# Patient Record
Sex: Female | Born: 1965 | Race: White | Hispanic: No | Marital: Married | Smoking: Never smoker
Health system: Southern US, Community
[De-identification: ages and names within clinical notes are randomized; demographics above are authoritative.]

## PROBLEM LIST (undated history)

## (undated) DIAGNOSIS — O1490 Unspecified pre-eclampsia, unspecified trimester: Secondary | ICD-10-CM

## (undated) DIAGNOSIS — K649 Unspecified hemorrhoids: Secondary | ICD-10-CM

## (undated) DIAGNOSIS — E785 Hyperlipidemia, unspecified: Secondary | ICD-10-CM

## (undated) DIAGNOSIS — R51 Headache: Secondary | ICD-10-CM

## (undated) DIAGNOSIS — R232 Flushing: Secondary | ICD-10-CM

## (undated) DIAGNOSIS — I10 Essential (primary) hypertension: Secondary | ICD-10-CM

## (undated) HISTORY — DX: Hyperlipidemia, unspecified: E78.5

## (undated) HISTORY — DX: Unspecified hemorrhoids: K64.9

## (undated) HISTORY — DX: Essential (primary) hypertension: I10

## (undated) HISTORY — DX: Headache: R51

## (undated) HISTORY — PX: OTHER SURGICAL HISTORY: SHX169

## (undated) HISTORY — PX: CHOLECYSTECTOMY: SHX55

## (undated) HISTORY — DX: Unspecified pre-eclampsia, unspecified trimester: O14.90

## (undated) HISTORY — DX: Flushing: R23.2

---

## 2005-03-05 ENCOUNTER — Ambulatory Visit: Payer: Self-pay | Admitting: Internal Medicine

## 2006-11-25 ENCOUNTER — Ambulatory Visit: Payer: Self-pay | Admitting: Internal Medicine

## 2008-02-03 ENCOUNTER — Ambulatory Visit: Payer: Self-pay | Admitting: Internal Medicine

## 2008-02-03 DIAGNOSIS — R519 Headache, unspecified: Secondary | ICD-10-CM

## 2008-02-03 HISTORY — DX: Headache, unspecified: R51.9

## 2008-02-18 ENCOUNTER — Telehealth: Payer: Self-pay | Admitting: Internal Medicine

## 2009-02-01 ENCOUNTER — Ambulatory Visit: Payer: Self-pay | Admitting: Internal Medicine

## 2010-03-05 ENCOUNTER — Encounter: Payer: Self-pay | Admitting: Internal Medicine

## 2010-04-19 ENCOUNTER — Ambulatory Visit: Payer: Self-pay | Admitting: Internal Medicine

## 2010-04-19 DIAGNOSIS — I10 Essential (primary) hypertension: Secondary | ICD-10-CM | POA: Insufficient documentation

## 2010-04-19 HISTORY — DX: Essential (primary) hypertension: I10

## 2010-06-09 ENCOUNTER — Ambulatory Visit: Payer: Self-pay | Admitting: Diagnostic Radiology

## 2010-06-25 ENCOUNTER — Ambulatory Visit: Payer: Self-pay | Admitting: Internal Medicine

## 2010-06-25 DIAGNOSIS — J069 Acute upper respiratory infection, unspecified: Secondary | ICD-10-CM | POA: Insufficient documentation

## 2010-09-20 ENCOUNTER — Emergency Department (HOSPITAL_BASED_OUTPATIENT_CLINIC_OR_DEPARTMENT_OTHER): Admission: EM | Admit: 2010-09-20 | Discharge: 2010-06-09 | Payer: Self-pay | Admitting: Emergency Medicine

## 2010-11-13 NOTE — Assessment & Plan Note (Signed)
Summary: 2 month rov/njr rsc bmp/njr   Vital Signs:  Patient profile:   45 year old female Weight:      168 pounds Temp:     98.3 degrees F oral BP sitting:   130 / 88  (left arm) Cuff size:   regular  Vitals Entered By: Kern Reap CMA Duncan Dull) (June 25, 2010 8:22 AM) CC: follow-up visit for blood pressure, dry cough Is Patient Diabetic? No Pain Assessment Patient in pain? no        CC:  follow-up visit for blood pressure and dry cough.  History of Present Illness: f/u BP---average BP 138/88 has had a few bps >160/90  has been to [facility] and ED for URI sxs---had difficulty breathing (went to ED).  cough is improving---now has some throat irritiation that triggers cough she has been given a cough syrup and OTC meds Also tried prednisone taper ([facility])  All other systems reviewed and were negative   Current Problems (verified): 1)  Elevated Bp Reading Without Dx Hypertension  (ICD-796.2) 2)  Headache  (ICD-784.0)  Current Medications (verified): 1)  Sumatriptan Succinate 100 Mg  Tabs (Sumatriptan Succinate) .Marland Kitchen.. 1 By Mouth Once Daily As Needed Headache 2)  Ibuprofen 200 Mg Tabs (Ibuprofen) .... Take One Tab By Mouth As Needed  Allergies (verified): 1)  Sulfamethoxazole (Sulfamethoxazole)  Physical Exam  General:  alert and well-developed.   Head:  normocephalic and atraumatic.   Eyes:  pupils equal and pupils round.   Ears:  R ear normal and L ear normal.   Neck:  No deformities, masses, or tenderness noted. Chest Wall:  no deformities and no tenderness.   Lungs:  normal respiratory effort and no intercostal retractions.   Heart:  normal rate and regular rhythm.   Skin:  turgor normal and color normal.     Impression & Recommendations:  Problem # 1:  ELEVATED BP READING WITHOUT DX HYPERTENSION (ICD-796.2) no ned for Rx at this time  Problem # 2:  URI (ICD-465.9) no evidence of bacterial infection. call for any concerns, increased sxs, fever, persistence  of sxs, wheeze, SOB.  i wonder about GERD since sxs are very nocturnal. Her updated medication list for this problem includes:    Ibuprofen 200 Mg Tabs (Ibuprofen) .Marland Kitchen... Take one tab by mouth as needed  Complete Medication List: 1)  Sumatriptan Succinate 100 Mg Tabs (Sumatriptan succinate) .Marland Kitchen.. 1 by mouth once daily as needed headache 2)  Ibuprofen 200 Mg Tabs (Ibuprofen) .... Take one tab by mouth as needed  Patient Instructions: 1)  Please schedule a follow-up appointment in 6 months.  Prevention & Chronic Care Immunizations   Influenza vaccine: Not documented    Tetanus booster: 01/14/2003: Historical    Pneumococcal vaccine: Not documented  Other Screening   Pap smear: Not documented    Mammogram: normal per pt  (03/05/2010)   Smoking status: never  (04/19/2010)  Lipids   Total Cholesterol: Not documented   LDL: Not documented   LDL Direct: Not documented   HDL: Not documented   Triglycerides: Not documented

## 2010-11-13 NOTE — Assessment & Plan Note (Signed)
Summary: BP ELEVATION/PS   Vital Signs:  Patient profile:   45 year old female Weight:      166 pounds BMI:     26.49 Temp:     98.8 degrees F oral Pulse rate:   72 / minute Pulse rhythm:   regular Resp:     12 per minute BP sitting:   130 / 78  (left arm) Cuff size:   regular  Vitals Entered By: Gladis Riffle, RN (April 19, 2010 8:16 AM) CC: BP 150/90 at gyn with migraine--BP 121/79-155-86 at [facility] Is Patient Diabetic? No   CC:  BP 150/90 at gyn with migraine--BP 121/79-155-86 at [facility].  History of Present Illness: concerned with BP gyn office bp 150/90 has checked since then. bps range from 121-155/79-105  no sxs of htn FHx of htn  Preventive Screening-Counseling & Management  Alcohol-Tobacco     Smoking Status: never  Current Medications (verified): 1)  Sumatriptan Succinate 100 Mg  Tabs (Sumatriptan Succinate) .Marland Kitchen.. 1 By Mouth Once Daily As Needed Headache  Allergies: 1)  Sulfamethoxazole (Sulfamethoxazole)  Physical Exam  General:  Well-developed,well-nourished,in no acute distress; alert,appropriate and cooperative throughout examination Head:  normocephalic and atraumatic.   Eyes:  pupils equal and pupils round.   Ears:  hearing aids Msk:  No deformity or scoliosis noted of thoracic or lumbar spine.   Neurologic:  cranial nerves II-XII intact and gait normal.     Impression & Recommendations:  Problem # 1:  ELEVATED BP READING WITHOUT DX HYPERTENSION (ICD-796.2) weight loss daily exercise f/u in 2 months  Complete Medication List: 1)  Sumatriptan Succinate 100 Mg Tabs (Sumatriptan succinate) .Marland Kitchen.. 1 by mouth once daily as needed headache  Patient Instructions: 1)  Please schedule a follow-up appointment in 2 months.   Immunization History:  Tetanus/Td Immunization History:    Tetanus/Td:  historical (01/14/2003)    Preventive Care Screening  Mammogram:    Date:  03/05/2010    Results:  normal per pt   Last Tetanus Booster:    Date:   01/14/2003    Results:  Historical

## 2010-12-24 ENCOUNTER — Ambulatory Visit: Payer: Self-pay | Admitting: Internal Medicine

## 2010-12-28 LAB — DIFFERENTIAL
Basophils Absolute: 0.1 10*3/uL (ref 0.0–0.1)
Lymphs Abs: 2.7 10*3/uL (ref 0.7–4.0)
Monocytes Relative: 11 % (ref 3–12)
Neutro Abs: 5.8 10*3/uL (ref 1.7–7.7)
Neutrophils Relative %: 59 % (ref 43–77)

## 2010-12-28 LAB — URINALYSIS, ROUTINE W REFLEX MICROSCOPIC
Hgb urine dipstick: NEGATIVE
Protein, ur: NEGATIVE mg/dL
Specific Gravity, Urine: 1.018 (ref 1.005–1.030)
pH: 6.5 (ref 5.0–8.0)

## 2010-12-28 LAB — BASIC METABOLIC PANEL
BUN: 19 mg/dL (ref 6–23)
Chloride: 104 mEq/L (ref 96–112)
Creatinine, Ser: 1 mg/dL (ref 0.4–1.2)
GFR calc non Af Amer: 60 mL/min — ABNORMAL LOW (ref >60)
Glucose, Bld: 102 mg/dL — ABNORMAL HIGH (ref 70–99)
Sodium: 140 mEq/L (ref 135–145)

## 2010-12-28 LAB — POCT B-TYPE NATRIURETIC PEPTIDE (BNP): B Natriuretic Peptide, POC: 7.1 pg/mL (ref 0–100)

## 2010-12-28 LAB — URINE MICROSCOPIC-ADD ON

## 2010-12-28 LAB — CBC: Platelets: 252 10*3/uL (ref 150–400)

## 2011-01-08 ENCOUNTER — Encounter: Payer: Self-pay | Admitting: Internal Medicine

## 2011-01-09 ENCOUNTER — Ambulatory Visit (INDEPENDENT_AMBULATORY_CARE_PROVIDER_SITE_OTHER): Payer: BC Managed Care – PPO | Admitting: Internal Medicine

## 2011-01-09 ENCOUNTER — Encounter: Payer: Self-pay | Admitting: Internal Medicine

## 2011-01-09 DIAGNOSIS — Z23 Encounter for immunization: Secondary | ICD-10-CM

## 2011-01-09 DIAGNOSIS — R51 Headache: Secondary | ICD-10-CM

## 2011-01-09 DIAGNOSIS — R030 Elevated blood-pressure reading, without diagnosis of hypertension: Secondary | ICD-10-CM

## 2011-01-09 MED ORDER — VERAPAMIL HCL ER 180 MG PO TBCR: 180.0000 mg | EXTENDED_RELEASE_TABLET | Freq: Every day | ORAL | Status: DC

## 2011-01-09 NOTE — Assessment & Plan Note (Signed)
Menstrual related  Uses imitrex with relief

## 2011-01-09 NOTE — Progress Notes (Signed)
  Subjective:    Patient ID: Belinda Maxwell, female    DOB: 1965-12-06, 45 y.o.   MRN: [id number]  HPI  Patient comes in for evaluation of blood pressure. Home blood pressure monitoring results are in the assessment and plan. She otherwise has no complaints. She denies any chest pain, neurologic deficits. No swelling of lower extremities. No shortness of breath.  Patient has headaches. Mostly menstrual-related. Imitrex with good results.  Past Medical History  Diagnosis Date  . Hearing loss   . Pre-eclampsia   . Headache    Past Surgical History  Procedure Date  . Cholecystectomy   . Hemorrhoid banding     reports that she has never smoked. She does not have any smokeless tobacco history on file. Her alcohol and drug histories not on file. family history includes Hyperlipidemia in her father and mother and Hypertension in her father and mother. Allergies  Allergen Reactions  . Sulfamethoxazole     REACTION: urticaria (hives)     Review of Systems  patient denies chest pain, shortness of breath, orthopnea. Denies lower extremity edema, abdominal pain, change in appetite, change in bowel movements. Patient denies rashes, musculoskeletal complaints. No other specific complaints in a complete review of systems.      Objective:   Physical Exam Well-developed female no acute distress. HEENT exam atraumatic, normocephalic,  Without jugular venous distention. Chest clear to auscultation cardiac exam S1 and S2 are regular. There is no edema.       Assessment & Plan:

## 2011-01-09 NOTE — Assessment & Plan Note (Signed)
Home BPS are in the 130-154/78-100 range.  Trial Ca++ channel blocker---may help headache too Side effects discussed

## 2011-02-20 ENCOUNTER — Encounter: Payer: Self-pay | Admitting: Internal Medicine

## 2011-02-20 ENCOUNTER — Ambulatory Visit (INDEPENDENT_AMBULATORY_CARE_PROVIDER_SITE_OTHER): Payer: BC Managed Care – PPO | Admitting: Internal Medicine

## 2011-02-20 VITALS — BP 144/94 | HR 76 | Temp 98.5°F | Ht 66.0 in | Wt 174.0 lb

## 2011-02-20 DIAGNOSIS — I10 Essential (primary) hypertension: Secondary | ICD-10-CM

## 2011-02-20 DIAGNOSIS — R030 Elevated blood-pressure reading, without diagnosis of hypertension: Secondary | ICD-10-CM

## 2011-02-20 MED ORDER — VERAPAMIL HCL ER 240 MG PO TBCR: 240.0000 mg | EXTENDED_RELEASE_TABLET | Freq: Every day | ORAL | Status: DC

## 2011-02-20 NOTE — Assessment & Plan Note (Signed)
Need better control increase verapamil See me 6 weeks

## 2011-02-20 NOTE — Progress Notes (Signed)
  Subjective:    Patient ID: Belinda Maxwell, female    DOB: 1966/03/18, 45 y.o.   MRN: [id number]  HPI  HTN---rarely checks home bps--140/90  Migraines--controlled with imitrex (prophylactic use of verapamil) Past Medical History  Diagnosis Date  . Hearing loss   . Pre-eclampsia   . Headache    Past Surgical History  Procedure Date  . Cholecystectomy   . Hemorrhoid banding     reports that she has never smoked. She does not have any smokeless tobacco history on file. Her alcohol and drug histories not on file. family history includes Hyperlipidemia in her father and mother and Hypertension in her father and mother. Allergies  Allergen Reactions  . Sulfamethoxazole     REACTION: urticaria (hives)     Review of Systems  patient denies chest pain, shortness of breath, orthopnea. Denies lower extremity edema, abdominal pain, change in appetite, change in bowel movements. Patient denies rashes, musculoskeletal complaints. No other specific complaints in a complete review of systems.      Objective:   Physical Exam  Well-developed well-nourished female in no acute distress. HEENT exam atraumatic, normocephalic, extraocular muscles are intact. Neck is supple. No jugular venous distention no thyromegaly. Chest clear to auscultation without increased work of breathing. Cardiac exam S1 and S2 are regular. Abdominal exam active bowel sounds, soft, nontender. Extremities no edema. Neurologic exam she is alert without any motor sensory deficits. Gait is normal.        Assessment & Plan:

## 2011-04-03 ENCOUNTER — Ambulatory Visit: Payer: BC Managed Care – PPO | Admitting: Internal Medicine

## 2011-05-10 LAB — HM PAP SMEAR

## 2011-05-13 ENCOUNTER — Ambulatory Visit: Payer: BC Managed Care – PPO | Admitting: Internal Medicine

## 2011-07-01 ENCOUNTER — Ambulatory Visit: Payer: BC Managed Care – PPO | Admitting: Internal Medicine

## 2011-07-13 IMAGING — CR DG CHEST 2V
2 series · 2 of 2 positions shown · non-contrast
Comparison: None

CLINICAL DATA: Shortness of breath.

CHEST - 2 VIEW

[w chest pa]
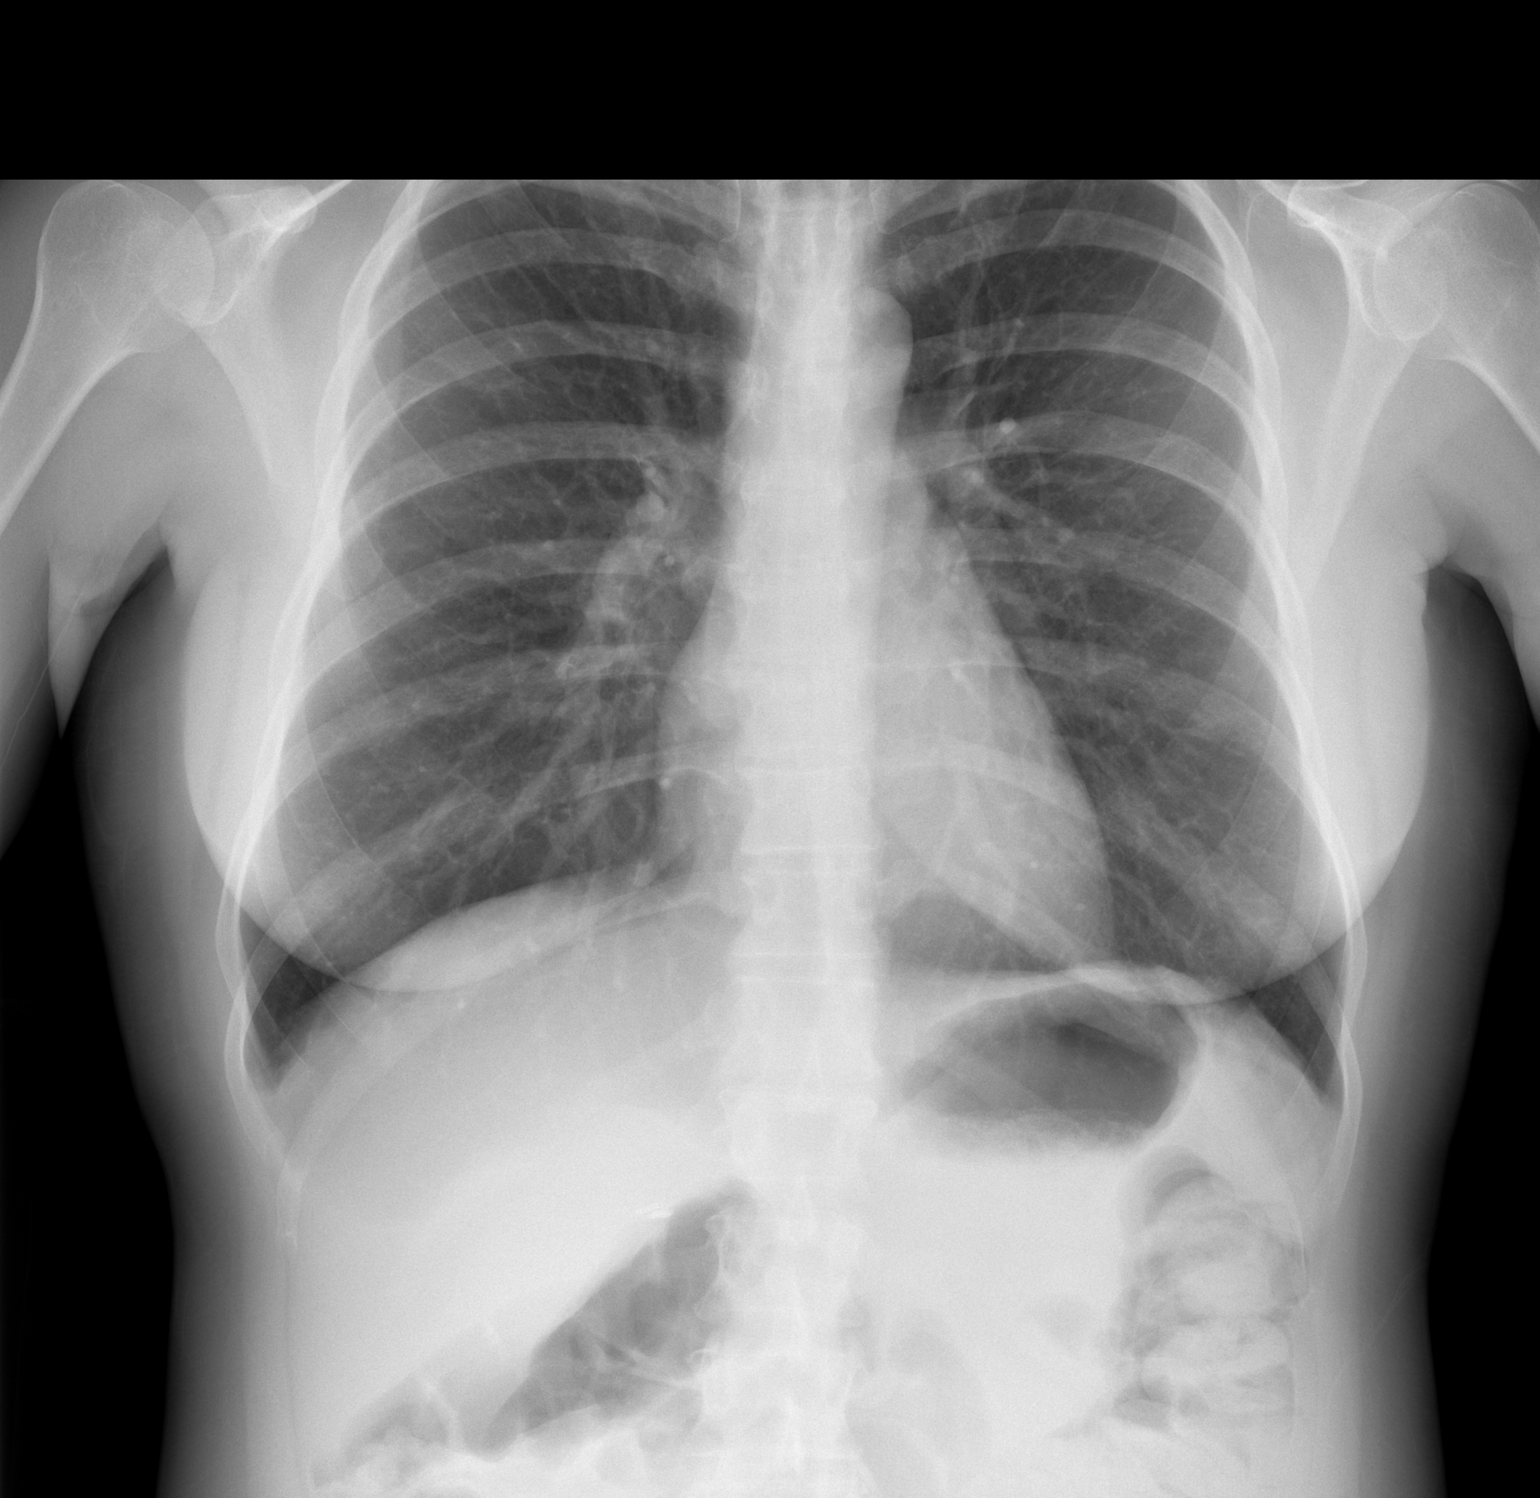

[w chest lat]
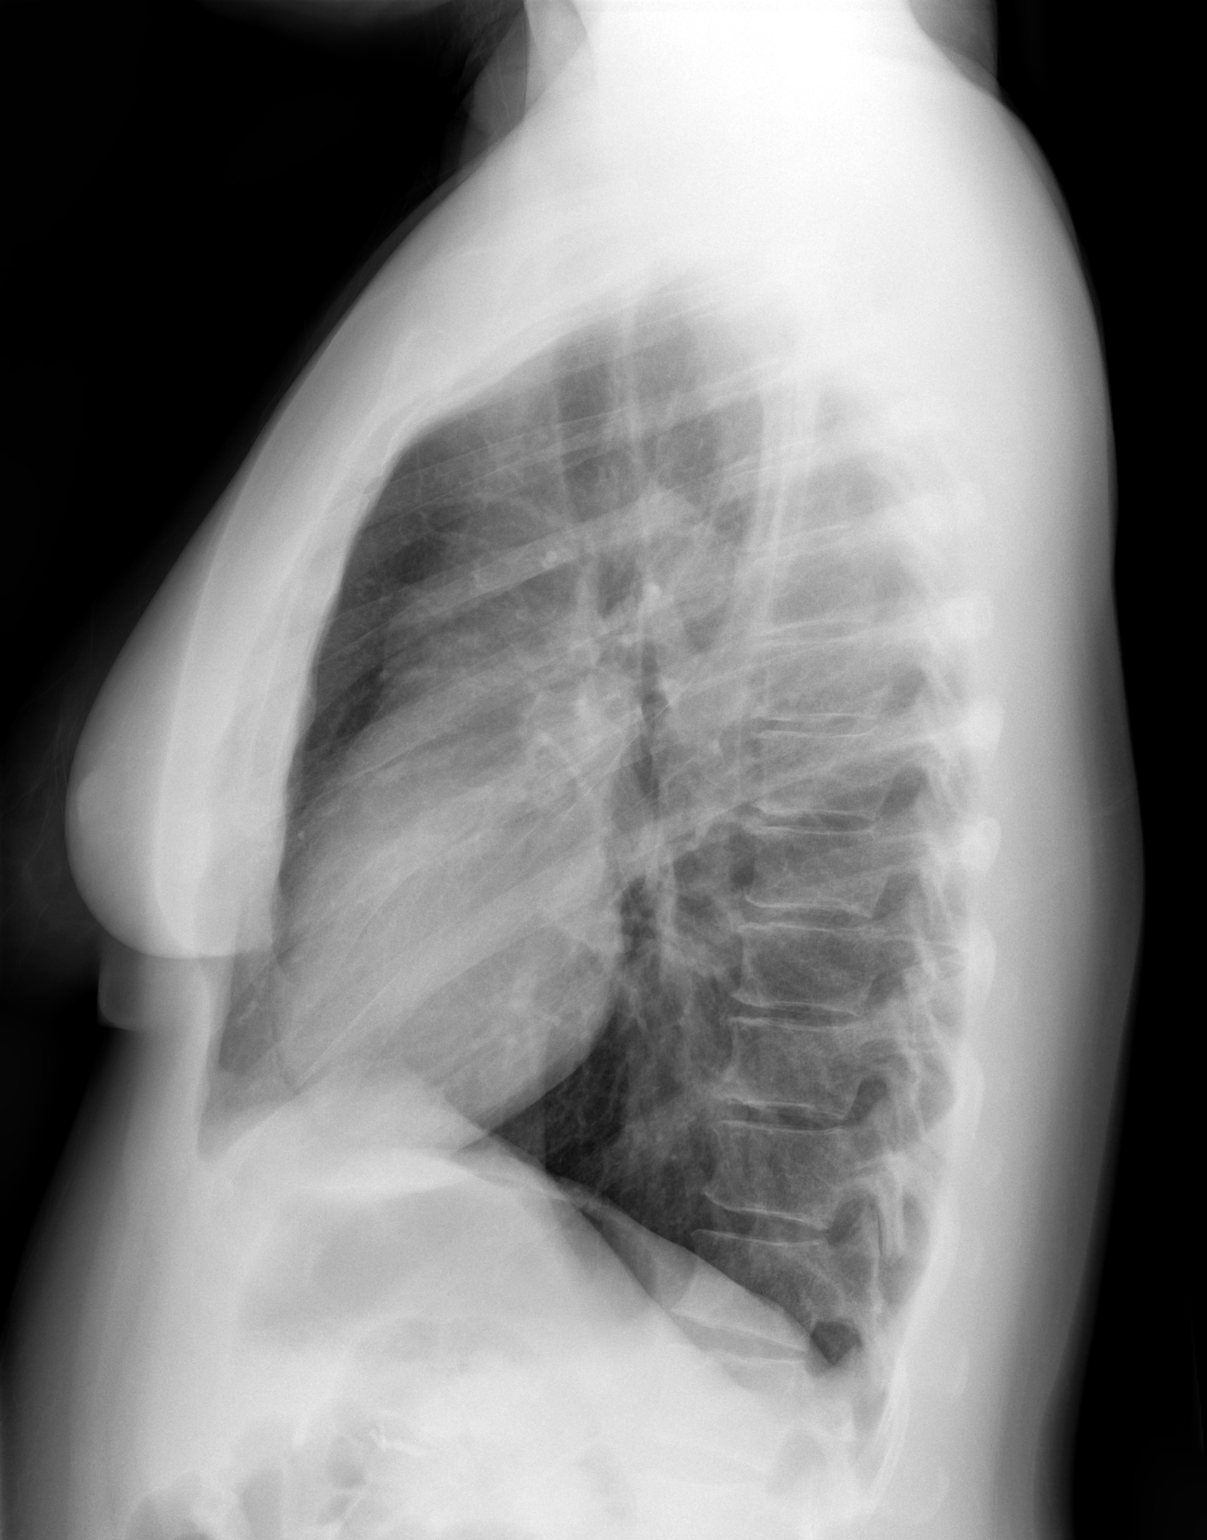

[2 of 2 positions shown; findings below may reference images not displayed]

FINDINGS: Heart and mediastinal contours are within normal limits.
No focal opacities or effusions.  No acute bony abnormality.
IMPRESSION: No active disease.

## 2011-07-30 ENCOUNTER — Ambulatory Visit: Payer: BC Managed Care – PPO | Admitting: Internal Medicine

## 2011-08-07 ENCOUNTER — Encounter: Payer: Self-pay | Admitting: Internal Medicine

## 2011-08-07 ENCOUNTER — Ambulatory Visit (INDEPENDENT_AMBULATORY_CARE_PROVIDER_SITE_OTHER): Payer: PRIVATE HEALTH INSURANCE | Admitting: Internal Medicine

## 2011-08-07 VITALS — BP 148/98 | HR 76 | Temp 98.5°F | Ht 66.0 in | Wt 176.0 lb

## 2011-08-07 DIAGNOSIS — I10 Essential (primary) hypertension: Secondary | ICD-10-CM

## 2011-08-07 MED ORDER — HYDROCORTISONE 2.5 % RE CREA: TOPICAL_CREAM | RECTAL | Status: DC

## 2011-08-07 MED ORDER — LISINOPRIL-HYDROCHLOROTHIAZIDE 20-25 MG PO TABS: 1.0000 | ORAL_TABLET | Freq: Every day | ORAL | Status: DC

## 2011-08-07 NOTE — Assessment & Plan Note (Signed)
Not as well controlled Need to change meds See new list Side effects discussed

## 2011-08-07 NOTE — Progress Notes (Signed)
  Subjective:    Patient ID: Belinda Maxwell, female    DOB: [birth year], 45 y.o.   MRN: [id number]  HPI  htn---tolerating verapamil but bps have creeped back up to mid 140s/90s. She feels well  Migraines---reasonably controlled  Past Medical History  Diagnosis Date  . Hearing loss   . Pre-eclampsia   . Headache    Past Surgical History  Procedure Date  . Cholecystectomy   . Hemorrhoid banding     reports that she has never smoked. She does not have any smokeless tobacco history on file. Her alcohol and drug histories not on file. family history includes Hyperlipidemia in her father and mother and Hypertension in her father and mother. Allergies  Allergen Reactions  . Sulfamethoxazole     REACTION: urticaria (hives)     Review of Systems  patient denies chest pain, shortness of breath, orthopnea. Denies lower extremity edema, abdominal pain, change in appetite, change in bowel movements. Patient denies rashes, musculoskeletal complaints. No other specific complaints in a complete review of systems.      Objective:   Physical Exam  Well-developed well-nourished female in no acute distress. HEENT exam atraumatic, normocephalic, extraocular muscles are intact. Neck is supple. No jugular venous distention no thyromegaly. Chest clear to auscultation without increased work of breathing. Cardiac exam S1 and S2 are regular. Abdominal exam active bowel sounds, soft, nontender. Extremities no edema. Neurologic exam she is alert without any motor sensory deficits. Gait is normal.        Assessment & Plan:

## 2011-10-09 ENCOUNTER — Emergency Department (HOSPITAL_COMMUNITY): Payer: No Typology Code available for payment source

## 2011-10-09 ENCOUNTER — Emergency Department (HOSPITAL_COMMUNITY)
Admission: EM | Admit: 2011-10-09 | Discharge: 2011-10-09 | Disposition: A | Discharge status: Home / Self Care | Payer: No Typology Code available for payment source | Attending: Emergency Medicine | Admitting: Emergency Medicine

## 2011-10-09 ENCOUNTER — Encounter (HOSPITAL_COMMUNITY): Payer: Self-pay | Admitting: Emergency Medicine

## 2011-10-09 DIAGNOSIS — R55 Syncope and collapse: Secondary | ICD-10-CM | POA: Insufficient documentation

## 2011-10-09 DIAGNOSIS — R112 Nausea with vomiting, unspecified: Secondary | ICD-10-CM | POA: Insufficient documentation

## 2011-10-09 DIAGNOSIS — I10 Essential (primary) hypertension: Secondary | ICD-10-CM | POA: Insufficient documentation

## 2011-10-09 DIAGNOSIS — J189 Pneumonia, unspecified organism: Secondary | ICD-10-CM | POA: Insufficient documentation

## 2011-10-09 LAB — DIFFERENTIAL
Basophils Absolute: 0 10*3/uL (ref 0.0–0.1)
Eosinophils Absolute: 0 10*3/uL (ref 0.0–0.7)
Monocytes Absolute: 0.8 10*3/uL (ref 0.1–1.0)
Neutro Abs: 8.8 10*3/uL — ABNORMAL HIGH (ref 1.7–7.7)

## 2011-10-09 LAB — URINALYSIS, ROUTINE W REFLEX MICROSCOPIC
Nitrite: NEGATIVE
Protein, ur: 30 mg/dL — AB
Urobilinogen, UA: 0.2 mg/dL (ref 0.0–1.0)
pH: 6 (ref 5.0–8.0)

## 2011-10-09 LAB — BASIC METABOLIC PANEL
CO2: 28 mEq/L (ref 19–32)
Calcium: 9.6 mg/dL (ref 8.4–10.5)
Chloride: 99 mEq/L (ref 96–112)
Creatinine, Ser: 1.11 mg/dL — ABNORMAL HIGH (ref 0.50–1.10)
Glucose, Bld: 125 mg/dL — ABNORMAL HIGH (ref 70–99)
Potassium: 2.9 mEq/L — ABNORMAL LOW (ref 3.5–5.1)
Sodium: 137 mEq/L (ref 135–145)

## 2011-10-09 LAB — CBC
MCH: 32.8 pg (ref 26.0–34.0)
MCHC: 35.8 g/dL (ref 30.0–36.0)
MCV: 91.5 fL (ref 78.0–100.0)
Platelets: 150 10*3/uL (ref 150–400)

## 2011-10-09 LAB — URINE MICROSCOPIC-ADD ON

## 2011-10-09 MED ORDER — SODIUM CHLORIDE 0.9 % IV BOLUS (SEPSIS)
1000.0000 mL | Freq: Once | INTRAVENOUS | Status: AC
  Administered 2011-10-09: 1000 mL via INTRAVENOUS

## 2011-10-09 MED ORDER — AZITHROMYCIN 250 MG PO TABS: ORAL_TABLET | ORAL | Status: DC

## 2011-10-09 MED ORDER — POTASSIUM CHLORIDE CRYS ER 20 MEQ PO TBCR
40.0000 meq | EXTENDED_RELEASE_TABLET | Freq: Two times a day (BID) | ORAL | Status: DC
  Administered 2011-10-09 (×2): 40 meq via ORAL
  Filled 2011-10-09: qty 2
  Filled 2011-10-09 (×2): qty 1

## 2011-10-09 MED ORDER — BENZONATATE 100 MG PO CAPS: 100.0000 mg | ORAL_CAPSULE | Freq: Three times a day (TID) | ORAL | Status: DC

## 2011-10-09 MED ORDER — DEXTROSE 5 % IV SOLN
1.0000 g | Freq: Once | INTRAVENOUS | Status: AC
  Administered 2011-10-09: 1 g via INTRAVENOUS
  Filled 2011-10-09: qty 10

## 2011-10-09 MED ORDER — DEXTROSE 5 % IV SOLN
500.0000 mg | Freq: Once | INTRAVENOUS | Status: AC
  Administered 2011-10-09: 500 mg via INTRAVENOUS
  Filled 2011-10-09: qty 500

## 2011-10-09 NOTE — ED Notes (Signed)
Pt presented to the ER with c/o syncopal episode, per husband pt loss a conscience for about 45 sec, body become stiff and eyes stayed open with fixated pupils. Pt did not react, shaking, seizure like movement (tonic-clonic) also noted in the triage. Pt face pale and moth dry.

## 2011-10-09 NOTE — ED Provider Notes (Signed)
History     CSN: [id number]  Arrival date & time 10/09/11  0704   First MD Initiated Contact with Patient 10/09/11 0732      Chief Complaint  Patient presents with  . Loss of Consciousness    (Consider location/radiation/quality/duration/timing/severity/associated sxs/prior treatment) Patient is a 45 y.o. female presenting with syncope. The history is provided by the patient and the spouse.  Loss of Consciousness Pertinent negatives include no chest pain, no abdominal pain, no headaches and no shortness of breath.   the patient is a 45 year old, female, with a history of hypertension, and migraine headaches.  She has had a nonproductive cough and nausea for several days.  5 days ago.  She had diarrhea as well, but that is resolved now.  This morning.  She was in the bathroom and felt as if she was in the vomit.  She had dry heaves and then, she fainted for about 30 seconds according to her husband.  She recovered rapidly afterwards.  Her nausea is resolved now.  She denies a headache.  She denies chest pain, abdominal pain.  She has not had a fever.  She denies shortness of breath, back pain, dysuria, or hematuria.  Past Medical History  Diagnosis Date  . Hearing loss   . Pre-eclampsia   . Headache     Past Surgical History  Procedure Date  . Cholecystectomy   . Hemorrhoid banding     Family History  Problem Relation Age of Onset  . Hyperlipidemia Mother   . Hypertension Mother   . Hyperlipidemia Father   . Hypertension Father     History  Substance Use Topics  . Smoking status: Never Smoker   . Smokeless tobacco: Not on file  . Alcohol Use:     OB History    Grav Para Term Preterm Abortions TAB SAB Ect Mult Living                  Review of Systems  Constitutional: Negative for fever, chills and diaphoresis.  HENT: Negative for congestion and neck pain.   Eyes: Negative for redness.  Respiratory: Positive for cough. Negative for chest tightness and  shortness of breath.   Cardiovascular: Positive for syncope. Negative for chest pain.  Gastrointestinal: Positive for nausea and vomiting. Negative for abdominal pain and diarrhea.  Genitourinary: Negative for dysuria and hematuria.  Musculoskeletal: Negative for back pain.  Skin: Negative for rash.  Neurological: Negative for light-headedness, numbness and headaches.  Psychiatric/Behavioral: Negative for confusion.    Allergies  Sulfamethoxazole  Home Medications   Current Outpatient Rx  Name Route Sig Dispense Refill  . ALBUTEROL SULFATE HFA 108 (90 BASE) MCG/ACT IN AERS Inhalation Inhale 2 puffs into the lungs every 6 (six) hours as needed. WHEEZING     . DEXTROMETHORPHAN POLISTIREX ER 30 MG/5ML PO LQCR Oral Take by mouth as needed.      . DELSYM PO Oral Take 5 mLs by mouth 2 (two) times daily.      Marland Kitchen DM-GUAIFENESIN ER 30-600 MG PO TB12 Oral Take 1 tablet by mouth every 12 (twelve) hours.      Marland Kitchen HYDROCORTISONE 2.5 % RE CREA  Apply rectally 2 times daily 30 g 1  . IBUPROFEN 200 MG PO TABS Oral Take 200 mg by mouth every 6 (six) hours as needed.      Marland Kitchen LISINOPRIL-HYDROCHLOROTHIAZIDE 20-25 MG PO TABS Oral Take 1 tablet by mouth daily. 90 tablet 3  . PROMETHAZINE-CODEINE 6.25-10 MG/5ML PO SYRP  Oral Take 7.5 mLs by mouth every 4 (four) hours as needed. cough     . SUMATRIPTAN SUCCINATE 100 MG PO TABS Oral Take 100 mg by mouth every 2 (two) hours as needed.        BP 107/63  Pulse 92  Temp(Src) 98.6 F (37 C) (Oral)  Resp 20  SpO2 97%  Physical Exam  Vitals reviewed. Constitutional: She is oriented to person, place, and time. She appears well-developed and well-nourished.  HENT:  Head: Normocephalic and atraumatic.  Eyes: Pupils are equal, round, and reactive to light.  Neck: Normal range of motion.  Cardiovascular: Normal rate, regular rhythm and normal heart sounds.   No murmur heard. Pulmonary/Chest: Effort normal. No respiratory distress. She has no wheezes. She has no  rales.       Rhonchi in the posterior left mid lung field  Abdominal: Soft. She exhibits no distension and no mass. There is no tenderness. There is no rebound and no guarding.  Musculoskeletal: Normal range of motion. She exhibits no edema and no tenderness.  Neurological: She is alert and oriented to person, place, and time. No cranial nerve deficit.  Skin: Skin is warm and dry. No rash noted. No erythema.  Psychiatric: She has a normal mood and affect. Her behavior is normal.    ED Course  Procedures (including critical care time) 45 year old, female, with a nonproductive cough, nausea, and vomiting.  Had a vasovagal episode following dry heaves.  She is asymptomatic now.  Since she has rhonchi and a nonproductive cough for several days, now.  We'll perform a chest x-ray, laboratory testing, and hydrate her.   Labs Reviewed  CBC  DIFFERENTIAL  BASIC METABOLIC PANEL  URINALYSIS, ROUTINE W REFLEX MICROSCOPIC   No results found.   8:56 AM sbp 98 Explained cxr findings. Since low bp will give IV bolus and since will be here for ivf bolus will give iv abxs to begin tx of pneumonia   10:12 AM Still getting ivf.  She says bp is always low since on meds.  She denies being light headed.    MDM  Pneumonia No hypoxia or resp distress.         Nicholes Stairs, MD 10/09/11 [phone]

## 2011-10-16 ENCOUNTER — Telehealth: Payer: Self-pay | Admitting: Internal Medicine

## 2011-10-16 NOTE — Telephone Encounter (Signed)
Pt was in the ER on the 12/26 with pneumonia and high blood pressure and needs a ER follow up appt. Next Appt is not until 11/07/10 please contact pt

## 2011-10-18 NOTE — Telephone Encounter (Signed)
appt scheduled

## 2011-10-22 ENCOUNTER — Encounter: Payer: Self-pay | Admitting: Internal Medicine

## 2011-10-22 ENCOUNTER — Ambulatory Visit (INDEPENDENT_AMBULATORY_CARE_PROVIDER_SITE_OTHER): Payer: PRIVATE HEALTH INSURANCE | Admitting: Internal Medicine

## 2011-10-22 DIAGNOSIS — I10 Essential (primary) hypertension: Secondary | ICD-10-CM

## 2011-10-22 DIAGNOSIS — R55 Syncope and collapse: Secondary | ICD-10-CM

## 2011-10-22 DIAGNOSIS — R51 Headache: Secondary | ICD-10-CM

## 2011-10-22 NOTE — Progress Notes (Signed)
Patient ID: Belinda Maxwell, female   DOB: 11/21/1965, 46 y.o.   MRN: [id number] Vasovagal syncope---reviewed ED records---she has stopped lisinopril/HCTZ Note hypokalemia at time of syncope  Headaches---controlled with imitrex  Past Medical History  Diagnosis Date  . Hearing loss   . Pre-eclampsia   . Headache     History   Social History  . Marital Status: Married    Spouse Name: N/A    Number of Children: N/A  . Years of Education: N/A   Occupational History  . Not on file.   Social History Main Topics  . Smoking status: Never Smoker   . Smokeless tobacco: Not on file  . Alcohol Use:   . Drug Use: No  . Sexually Active:    Other Topics Concern  . Not on file   Social History Narrative  . No narrative on file    Past Surgical History  Procedure Date  . Cholecystectomy   . Hemorrhoid banding     Family History  Problem Relation Age of Onset  . Hyperlipidemia Mother   . Hypertension Mother   . Hyperlipidemia Father   . Hypertension Father   . Heart disease Father 65    MI/ CAD with AFIB    Allergies  Allergen Reactions  . Sulfamethoxazole     REACTION: urticaria (hives)    Current Outpatient Prescriptions on File Prior to Visit  Medication Sig Dispense Refill  . dextromethorphan-guaiFENesin (MUCINEX DM) 30-600 MG per 12 hr tablet Take 1 tablet by mouth every 12 (twelve) hours.        . hydrocortisone (ANUSOL-HC) 2.5 % rectal cream Apply rectally 2 times daily  30 g  1  . ibuprofen (ADVIL,MOTRIN) 200 MG tablet Take 200 mg by mouth every 6 (six) hours as needed.        . SUMAtriptan (IMITREX) 100 MG tablet Take 100 mg by mouth every 2 (two) hours as needed.           patient denies chest pain, shortness of breath, orthopnea. Denies lower extremity edema, abdominal pain, change in appetite, change in bowel movements. Patient denies rashes, musculoskeletal complaints. No other specific complaints in a complete review of systems.   BP 122/90  Pulse 68   Temp(Src) 98.2 F (36.8 C) (Oral)  Wt 176 lb (79.833 kg)  Well-developed well-nourished female in no acute distress. HEENT exam atraumatic, normocephalic, extraocular muscles are intact. Neck is supple. No jugular venous distention no thyromegaly. Chest clear to auscultation without increased work of breathing. Cardiac exam S1 and S2 are regular. Abdominal exam active bowel sounds, soft, nontender.    A/P--=vasovagal syncope---resolved Stay off lisinopril for now See me 3 months Monitor home BPs

## 2012-02-05 ENCOUNTER — Ambulatory Visit: Payer: PRIVATE HEALTH INSURANCE | Admitting: Internal Medicine

## 2012-02-12 ENCOUNTER — Encounter: Payer: Self-pay | Admitting: Internal Medicine

## 2012-02-12 ENCOUNTER — Ambulatory Visit (INDEPENDENT_AMBULATORY_CARE_PROVIDER_SITE_OTHER): Payer: PRIVATE HEALTH INSURANCE | Admitting: Internal Medicine

## 2012-02-12 VITALS — BP 102/70 | HR 64 | Temp 98.6°F | Wt 170.0 lb

## 2012-02-12 DIAGNOSIS — I10 Essential (primary) hypertension: Secondary | ICD-10-CM

## 2012-02-12 LAB — BASIC METABOLIC PANEL
BUN: 20 mg/dL (ref 6–23)
CO2: 28 mEq/L (ref 19–32)
Calcium: 10.3 mg/dL (ref 8.4–10.5)
Creatinine, Ser: 1.1 mg/dL (ref 0.4–1.2)
GFR: 57.53 mL/min — ABNORMAL LOW (ref >60.00)
Glucose, Bld: 90 mg/dL (ref 70–99)

## 2012-02-12 MED ORDER — LISINOPRIL-HYDROCHLOROTHIAZIDE 20-25 MG PO TABS: 0.5000 | ORAL_TABLET | Freq: Every day | ORAL | Status: DC

## 2012-02-12 NOTE — Progress Notes (Signed)
Patient ID: Belinda Maxwell, female   DOB: [birth year], 46 y.o.   MRN: [id number]  htn---resumed bp meds in February- She is feeling well  Average bp now approx 120/70  Headaches--- infrequent usually - imitrex is helpful  Past Medical History  Diagnosis Date  . Hearing loss   . Pre-eclampsia   . Headache     History   Social History  . Marital Status: Married    Spouse Name: N/A    Number of Children: N/A  . Years of Education: N/A   Occupational History  . Not on file.   Social History Main Topics  . Smoking status: Never Smoker   . Smokeless tobacco: Not on file  . Alcohol Use:   . Drug Use: No  . Sexually Active:    Other Topics Concern  . Not on file   Social History Narrative  . No narrative on file    Past Surgical History  Procedure Date  . Cholecystectomy   . Hemorrhoid banding     Family History  Problem Relation Age of Onset  . Hyperlipidemia Mother   . Hypertension Mother   . Hyperlipidemia Father   . Hypertension Father   . Heart disease Father 36    MI/ CAD with AFIB    Allergies  Allergen Reactions  . Sulfamethoxazole     REACTION: urticaria (hives)    Current Outpatient Prescriptions on File Prior to Visit  Medication Sig Dispense Refill  . hydrocortisone (ANUSOL-HC) 2.5 % rectal cream Apply rectally 2 times daily  30 g  1  . ibuprofen (ADVIL,MOTRIN) 200 MG tablet Take 200 mg by mouth every 6 (six) hours as needed.        Marland Kitchen lisinopril-hydrochlorothiazide (PRINZIDE,ZESTORETIC) 20-25 MG per tablet Take 1 tablet by mouth daily.      . SUMAtriptan (IMITREX) 100 MG tablet Take 100 mg by mouth every 2 (two) hours as needed.           patient denies chest pain, shortness of breath, orthopnea. Denies lower extremity edema, abdominal pain, change in appetite, change in bowel movements. Patient denies rashes, musculoskeletal complaints. No other specific complaints in a complete review of systems.   BP 102/70  Pulse 64  Temp(Src) 98.6 F (37  C) (Oral)  Wt 170 lb (77.111 kg)  Well-developed well-nourished female in no acute distress. HEENT exam atraumatic, normocephalic, extraocular muscles are intact. Neck is supple. No jugular venous distention no thyromegaly. Chest clear to auscultation without increased work of breathing. Cardiac exam S1 and S2 are regular. Abdominal exam active bowel sounds, soft, nontender. Extremities no edema. Neurologic exam she is alert without any motor sensory deficits. Gait is normal.

## 2012-09-22 ENCOUNTER — Other Ambulatory Visit: Payer: Self-pay | Admitting: Internal Medicine

## 2012-12-21 ENCOUNTER — Other Ambulatory Visit: Payer: Self-pay | Admitting: *Deleted

## 2012-12-21 MED ORDER — SUMATRIPTAN SUCCINATE 100 MG PO TABS: 100.0000 mg | ORAL_TABLET | ORAL | Status: DC | PRN

## 2013-01-19 ENCOUNTER — Ambulatory Visit: Payer: No Typology Code available for payment source | Admitting: Internal Medicine

## 2013-02-08 ENCOUNTER — Ambulatory Visit (INDEPENDENT_AMBULATORY_CARE_PROVIDER_SITE_OTHER): Payer: PRIVATE HEALTH INSURANCE | Admitting: Internal Medicine

## 2013-02-08 VITALS — BP 102/76 | HR 84 | Temp 98.1°F | Wt 176.0 lb

## 2013-02-08 DIAGNOSIS — I10 Essential (primary) hypertension: Secondary | ICD-10-CM

## 2013-02-08 DIAGNOSIS — R51 Headache: Secondary | ICD-10-CM

## 2013-02-08 MED ORDER — TRIAMCINOLONE ACETONIDE 0.5 % EX OINT: TOPICAL_OINTMENT | Freq: Two times a day (BID) | CUTANEOUS | Status: DC

## 2013-02-08 MED ORDER — SUMATRIPTAN SUCCINATE 100 MG PO TABS: 100.0000 mg | ORAL_TABLET | ORAL | Status: DC | PRN

## 2013-02-08 MED ORDER — LISINOPRIL-HYDROCHLOROTHIAZIDE 20-25 MG PO TABS: 0.5000 | ORAL_TABLET | Freq: Every day | ORAL | Status: DC

## 2013-02-08 NOTE — Progress Notes (Signed)
htn- tolerating meds  Migraine headache- typically around menstrual cycle-- improving with time  Rash on legs-- small areas x 3  Bilaterally  Past Medical History  Diagnosis Date  . Hearing loss   . Pre-eclampsia   . Headache     History   Social History  . Marital Status: Married    Spouse Name: N/A    Number of Children: N/A  . Years of Education: N/A   Occupational History  . Not on file.   Social History Main Topics  . Smoking status: Never Smoker   . Smokeless tobacco: Not on file  . Alcohol Use:   . Drug Use: No  . Sexually Active:    Other Topics Concern  . Not on file   Social History Narrative  . No narrative on file    Past Surgical History  Procedure Laterality Date  . Cholecystectomy    . Hemorrhoid banding      Family History  Problem Relation Age of Onset  . Hyperlipidemia Mother   . Hypertension Mother   . Hyperlipidemia Father   . Hypertension Father   . Heart disease Father 6    MI/ CAD with AFIB    Allergies  Allergen Reactions  . Sulfamethoxazole     REACTION: urticaria (hives)    Current Outpatient Prescriptions on File Prior to Visit  Medication Sig Dispense Refill  . ibuprofen (ADVIL,MOTRIN) 200 MG tablet Take 200 mg by mouth every 6 (six) hours as needed.        Marland Kitchen lisinopril-hydrochlorothiazide (PRINZIDE,ZESTORETIC) 20-25 MG per tablet Take 0.5 tablets by mouth daily.      Marland Kitchen PROCTOSOL HC 2.5 % rectal cream APPLY RECTALLY TWICE A DAY  28.35 g  0  . SUMAtriptan (IMITREX) 100 MG tablet Take 1 tablet (100 mg total) by mouth every 2 (two) hours as needed.  10 tablet  1   No current facility-administered medications on file prior to visit.     patient denies chest pain, shortness of breath, orthopnea. Denies lower extremity edema, abdominal pain, change in appetite, change in bowel movements. Patient denies rashes, musculoskeletal complaints. No other specific complaints in a complete review of systems.   BP 102/76  Pulse 84   Temp(Src) 98.1 F (36.7 C) (Oral)  Wt 176 lb (79.833 kg)  BMI 28.42 kg/m2  Well-developed well-nourished female in no acute distress. HEENT exam atraumatic, normocephalic, extraocular muscles are intact. Neck is supple. No jugular venous distention no thyromegaly. Chest clear to auscultation without increased work of breathing. Cardiac exam S1 and S2 are regular. Abdominal exam active bowel sounds, soft, nontender. Extremities no edema. Neurologic exam she is alert without any motor sensory deficits. Gait is normal. Derm-- area of scaling skin- 1.5 cm  A/p eczema-- triamcinolone cream

## 2013-02-08 NOTE — Assessment & Plan Note (Signed)
Well controlled continue meds 

## 2013-02-08 NOTE — Assessment & Plan Note (Signed)
Refill meds

## 2013-02-12 ENCOUNTER — Ambulatory Visit: Payer: No Typology Code available for payment source | Admitting: Internal Medicine

## 2013-08-13 ENCOUNTER — Ambulatory Visit (INDEPENDENT_AMBULATORY_CARE_PROVIDER_SITE_OTHER): Payer: BC Managed Care – PPO | Admitting: Internal Medicine

## 2013-08-13 ENCOUNTER — Encounter: Payer: Self-pay | Admitting: Internal Medicine

## 2013-08-13 VITALS — BP 120/80 | HR 68 | Temp 98.0°F | Ht 66.0 in | Wt 178.0 lb

## 2013-08-13 DIAGNOSIS — I10 Essential (primary) hypertension: Secondary | ICD-10-CM

## 2013-08-13 DIAGNOSIS — Z23 Encounter for immunization: Secondary | ICD-10-CM

## 2013-08-13 LAB — CBC WITH DIFFERENTIAL/PLATELET
Basophils Relative: 0.5 % (ref 0.0–3.0)
Eosinophils Absolute: 0.2 10*3/uL (ref 0.0–0.7)
MCHC: 34.5 g/dL (ref 30.0–36.0)
MCV: 95.6 fl (ref 78.0–100.0)
Monocytes Absolute: 0.5 10*3/uL (ref 0.1–1.0)
Neutro Abs: 3 10*3/uL (ref 1.4–7.7)
Neutrophils Relative %: 52.2 % (ref 43.0–77.0)
RBC: 4.09 Mil/uL (ref 3.87–5.11)
RDW: 12.9 % (ref 11.5–14.6)

## 2013-08-13 LAB — BASIC METABOLIC PANEL
BUN: 15 mg/dL (ref 6–23)
Calcium: 10.3 mg/dL (ref 8.4–10.5)
Creatinine, Ser: 1 mg/dL (ref 0.4–1.2)
GFR: 63.95 mL/min (ref >60.00)
Glucose, Bld: 90 mg/dL (ref 70–99)

## 2013-08-13 LAB — LIPID PANEL
Cholesterol: 177 mg/dL (ref 0–200)
HDL: 52.4 mg/dL (ref >39.00)
Triglycerides: 129 mg/dL (ref 0.0–149.0)
VLDL: 25.8 mg/dL (ref 0.0–40.0)

## 2013-08-13 LAB — HEPATIC FUNCTION PANEL
AST: 26 U/L (ref 0–37)
Albumin: 4.3 g/dL (ref 3.5–5.2)

## 2013-08-13 MED ORDER — SUMATRIPTAN SUCCINATE 100 MG PO TABS: 100.0000 mg | ORAL_TABLET | ORAL | Status: DC | PRN

## 2013-08-13 MED ORDER — LISINOPRIL-HYDROCHLOROTHIAZIDE 20-25 MG PO TABS: 0.5000 | ORAL_TABLET | Freq: Every day | ORAL | Status: DC

## 2013-08-13 NOTE — Progress Notes (Signed)
htn- tolerating meds without difficulty. Home BPs 120s/70s.   Headaches- occasional use of sumatriptan.   Reviewed pmh, meds  ROS: unremarkable  Exam- reviewed vitals Chest CTA cv - REG RATE abd- soft, nt

## 2013-08-13 NOTE — Assessment & Plan Note (Signed)
Adequate control Continue meds 

## 2013-08-20 NOTE — Progress Notes (Signed)
Quick Note:  Called and spoke with pt and pt is aware. ______ 

## 2013-09-06 ENCOUNTER — Other Ambulatory Visit: Payer: Self-pay | Admitting: *Deleted

## 2013-09-06 MED ORDER — SUMATRIPTAN SUCCINATE 100 MG PO TABS: 100.0000 mg | ORAL_TABLET | ORAL | Status: DC | PRN

## 2013-09-16 ENCOUNTER — Other Ambulatory Visit: Payer: Self-pay | Admitting: *Deleted

## 2013-12-02 ENCOUNTER — Other Ambulatory Visit: Payer: Self-pay | Admitting: Internal Medicine

## 2014-06-06 ENCOUNTER — Ambulatory Visit (INDEPENDENT_AMBULATORY_CARE_PROVIDER_SITE_OTHER): Payer: BC Managed Care – PPO | Admitting: Family Medicine

## 2014-06-06 ENCOUNTER — Encounter: Payer: Self-pay | Admitting: Family Medicine

## 2014-06-06 ENCOUNTER — Telehealth: Payer: Self-pay | Admitting: Family Medicine

## 2014-06-06 VITALS — BP 110/80 | HR 92 | Temp 98.2°F | Ht 66.5 in | Wt 179.5 lb

## 2014-06-06 DIAGNOSIS — R51 Headache: Secondary | ICD-10-CM

## 2014-06-06 DIAGNOSIS — Z7689 Persons encountering health services in other specified circumstances: Secondary | ICD-10-CM

## 2014-06-06 DIAGNOSIS — Z7189 Other specified counseling: Secondary | ICD-10-CM

## 2014-06-06 DIAGNOSIS — M722 Plantar fascial fibromatosis: Secondary | ICD-10-CM

## 2014-06-06 DIAGNOSIS — I10 Essential (primary) hypertension: Secondary | ICD-10-CM

## 2014-06-06 MED ORDER — MELOXICAM 15 MG PO TABS: 15.0000 mg | ORAL_TABLET | Freq: Every day | ORAL | Status: DC | PRN

## 2014-06-06 MED ORDER — SUMATRIPTAN SUCCINATE 100 MG PO TABS: 100.0000 mg | ORAL_TABLET | ORAL | Status: DC | PRN

## 2014-06-06 MED ORDER — LISINOPRIL-HYDROCHLOROTHIAZIDE 20-25 MG PO TABS: 0.5000 | ORAL_TABLET | Freq: Every day | ORAL | Status: DC

## 2014-06-06 NOTE — Progress Notes (Signed)
No chief complaint on file.   HPI:  Belinda Maxwell is here to establish care.  Last PCP and physical: sees Dr. Dema Severin for gyn yearly physical; UTD on pap and mammo  Has the following chronic problems and concerns today:  Patient Active Problem List   Diagnosis Date Noted  . Hypertension 04/19/2010  . Headache(784.0) 02/03/2008   1)Plantar Fasciitis: -take 1 meloxicam 15 mg tab every few months  2) HTN: -takes 1/2 tab lisinpril-hctz 20-25 -denies: CP, SOB, palpitations, swelling  3) Migraines: -doing better, no period since March 2015 -takes imitrex and tylenol for these and works well -no change or worsening in headaches other then less frequent  4)HX of hemorrhoids: -has had surgery, banding, "everything!" -intermittent hemorrhoids -has not had her screening coloscopy - she declined this today   ROS negative for unless reported above: fevers, unintentional weight loss, hearing or vision loss, chest pain, palpitations, struggling to breath, hemoptysis, melena, hematochezia, hematuria, falls, loc, si, thoughts of self harm  Past Medical History  Diagnosis Date  . Hearing loss   . Pre-eclampsia   . Headache(784.0)     Family History  Problem Relation Age of Onset  . Hyperlipidemia Mother   . Hypertension Mother   . Migraines Mother   . Hyperlipidemia Father   . Hypertension Father   . Heart disease Father 37    MI/ CAD with AFIB  . Migraines Sister   . Migraines Son     History   Social History  . Marital Status: Married    Spouse Name: N/A    Number of Children: N/A  . Years of Education: N/A   Social History Main Topics  . Smoking status: Never Smoker   . Smokeless tobacco: None  . Alcohol Use: No  . Drug Use: No  . Sexual Activity: None   Other Topics Concern  . None   Social History Narrative   Work or School: homemaker, looking for job      Home Situation: lives with husband and two children      Spiritual Beliefs: Christian      Lifestyle: tries to walk dog on a regular basis; diet is so so             Current outpatient prescriptions:ibuprofen (ADVIL,MOTRIN) 200 MG tablet, Take 200 mg by mouth every 6 (six) hours as needed.  , Disp: , Rfl: ;  lisinopril-hydrochlorothiazide (PRINZIDE,ZESTORETIC) 20-25 MG per tablet, Take 0.5 tablets by mouth daily., Disp: 90 tablet, Rfl: 3;  PROCTOSOL HC 2.5 % rectal cream, APPLY RECTALLY TWICE A DAY, Disp: 28.35 g, Rfl: 0 SUMAtriptan (IMITREX) 100 MG tablet, Take 1 tablet (100 mg total) by mouth every 2 (two) hours as needed., Disp: 9 tablet, Rfl: 6;  meloxicam (MOBIC) 15 MG tablet, Take 1 tablet (15 mg total) by mouth daily as needed for pain., Disp: 30 tablet, Rfl: 0  EXAM:  Filed Vitals:   06/06/14 0939  BP: 110/80  Pulse: 92  Temp: 98.2 F (36.8 C)    Body mass index is 28.54 kg/(m^2).  GENERAL: vitals reviewed and listed above, alert, oriented, appears well hydrated and in no acute distress  HEENT: atraumatic, conjunttiva clear, no obvious abnormalities on inspection of external nose and ears  NECK: no obvious masses on inspection  LUNGS: clear to auscultation bilaterally, no wheezes, rales or rhonchi, good air movement  CV: HRRR, no peripheral edema  MS: moves all extremities without noticeable abnormality  PSYCH: pleasant and cooperative, no obvious depression  or anxiety  ASSESSMENT AND PLAN:  Discussed the following assessment and plan:  Headache(784.0) - Plan: SUMAtriptan (IMITREX) 100 MG tablet  Essential hypertension - Plan: lisinopril-hydrochlorothiazide (PRINZIDE,ZESTORETIC) 20-25 MG per tablet  Plantar fasciitis - Plan: meloxicam (MOBIC) 15 MG tablet  Encounter to establish care  -We reviewed the PMH, PSH, FH, SH, Meds and Allergies. -We provided refills for any medications we will prescribe as needed. -We addressed current concerns per orders and patient instructions. -We have asked for records for pertinent exams, studies, vaccines and notes  from previous providers. -We have advised patient to follow up per instructions below. -refilled meds, discussed risks -follow up for physical and labs  -Patient advised to return or notify a doctor immediately if symptoms worsen or persist or new concerns arise.  Patient Instructions  -PLEASE SIGN UP FOR MYCHART TODAY   We recommend the following healthy lifestyle measures: - eat a healthy diet consisting of lots of vegetables, fruits, beans, nuts, seeds, healthy meats such as white chicken and fish and whole grains.  - avoid fried foods, fast food, processed foods, sodas, red meet and other fattening foods.  - get a least 150 minutes of aerobic exercise per week.   Call us if you decide you need an order for the colonoscopy  Flu vaccine   Follow up in: 3-6 months for your physical      Lucretia Kern.

## 2014-06-06 NOTE — Patient Instructions (Signed)
-  PLEASE SIGN UP FOR MYCHART TODAY   We recommend the following healthy lifestyle measures: - eat a healthy diet consisting of lots of vegetables, fruits, beans, nuts, seeds, healthy meats such as white chicken and fish and whole grains.  - avoid fried foods, fast food, processed foods, sodas, red meet and other fattening foods.  - get a least 150 minutes of aerobic exercise per week.   Call us if you decide you need an order for the colonoscopy  Flu vaccine   Follow up in: 3-6 months for your physical

## 2014-06-06 NOTE — Progress Notes (Signed)
Pre visit review using our clinic review tool, if applicable. No additional management support is needed unless otherwise documented below in the visit note. 

## 2014-06-06 NOTE — Telephone Encounter (Signed)
Relevant patient education assigned to patient using Emmi. ° °

## 2014-08-12 ENCOUNTER — Ambulatory Visit: Payer: BC Managed Care – PPO | Admitting: Internal Medicine

## 2014-08-17 ENCOUNTER — Encounter: Payer: Self-pay | Admitting: Family Medicine

## 2014-08-17 ENCOUNTER — Ambulatory Visit (INDEPENDENT_AMBULATORY_CARE_PROVIDER_SITE_OTHER): Payer: BC Managed Care – PPO | Admitting: Family Medicine

## 2014-08-17 VITALS — BP 112/82 | HR 60 | Temp 97.7°F | Ht 67.0 in | Wt 181.8 lb

## 2014-08-17 DIAGNOSIS — M25561 Pain in right knee: Secondary | ICD-10-CM

## 2014-08-17 DIAGNOSIS — Z0000 Encounter for general adult medical examination without abnormal findings: Secondary | ICD-10-CM

## 2014-08-17 DIAGNOSIS — Z23 Encounter for immunization: Secondary | ICD-10-CM

## 2014-08-17 DIAGNOSIS — M25511 Pain in right shoulder: Secondary | ICD-10-CM

## 2014-08-17 DIAGNOSIS — K649 Unspecified hemorrhoids: Secondary | ICD-10-CM

## 2014-08-17 LAB — HEMOGLOBIN A1C: Hgb A1c MFr Bld: 5.4 % (ref 4.6–6.5)

## 2014-08-17 LAB — LIPID PANEL
CHOL/HDL RATIO: 4
CHOLESTEROL: 181 mg/dL (ref 0–200)
HDL: 48.5 mg/dL (ref >39.00)
LDL Cholesterol: 112 mg/dL — ABNORMAL HIGH (ref 0–99)
NonHDL: 132.5
TRIGLYCERIDES: 101 mg/dL (ref 0.0–149.0)
VLDL: 20.2 mg/dL (ref 0.0–40.0)

## 2014-08-17 MED ORDER — HYDROCORTISONE 2.5 % RE CREA: TOPICAL_CREAM | RECTAL | Status: DC

## 2014-08-17 NOTE — Addendum Note (Signed)
Addended by: Lucretia Kern on: 08/17/2014 12:15 PM   Modules accepted: Level of Service

## 2014-08-17 NOTE — Patient Instructions (Signed)
BEFORE YOU LEAVE: -labs -flu shot -stool cards -rotator cuff exercises -follow up in 1 month  -We have ordered labs or studies at this visit. It can take up to 1-2 weeks for results and processing. We will contact you with instructions IF your results are abnormal. Normal results will be released to your [facility]. If you have not heard from [place] or can not find your results in [facility] in 2 weeks please contact our office.  -PLEASE SIGN UP FOR MYCHART TODAY   We recommend the following healthy lifestyle measures: - eat a healthy diet consisting of lots of vegetables, fruits, beans, nuts, seeds, healthy meats such as white chicken and fish and whole grains.  - avoid fried foods, fast food, processed foods, sodas, red meet and other fattening foods.  - get a least 150 minutes of aerobic exercise per week.    FOR KNEE PAIN: -call me in 2 weeks if not resolving - no strenuous activities until healed  FOR SHOULDER: -exercises provided -follow up if worsens or becomes persistent

## 2014-08-17 NOTE — Progress Notes (Addendum)
HPI:  Here for CPE and a number of new complaints and concerns:  -Concerns and/or follow up today:   1) Knee Pain: -started a few weeks ago a few hours after doing a 5 k - she does not usually run -constant, improving, mild pain now in posterior upper calf  -denies: swelling, clicking, giving away, weakness, numbness, erythema  2)R shoulder pain: -on and off for several months -mild tenderness in post upper R shoulder with certain movements - abd with ext in particular -denies: weakness, numbness, radiation  3) Hemorrhoids: -wants refill on cream -no bleeding or pain currently  -Diet: variety of foods, balance and well rounded  -Exercise: regular exercise  -Taking folic acid, vitamin D or calcium:yes  -Diabetes and Dyslipidemia Screening:FASTING today  -Hx of HTN: no  -Vaccines: UTD  -pap history: has had pap in 2013/14 and normal - sees Dr. Brien Few ob/gyn  -FDLMP:n/a  -sexual activity: yes, female partner, no new partners  -wants STI testing: no  -FH breast, colon or ovarian ca: see FH Last mammogram: done Last colon cancer screening: wants to do stool cards  -Alcohol, Tobacco, drug use: see social history  Review of Systems - no fevers, unintentional weight loss, vision loss, hearing loss, chest pain, sob, hemoptysis, melena, hematochezia, hematuria, genital discharge, changing or concerning skin lesions, bleeding, bruising, loc, thoughts of self harm or SI  Past Medical History  Diagnosis Date  . Hearing loss   . Pre-eclampsia   . UDJSHFWY(637.8)     Past Surgical History  Procedure Laterality Date  . Cholecystectomy    . Hemorrhoid banding      Family History  Problem Relation Age of Onset  . Hyperlipidemia Mother   . Hypertension Mother   . Migraines Mother   . Hyperlipidemia Father   . Hypertension Father   . Heart disease Father 67    MI/ CAD with AFIB  . Migraines Sister   . Migraines Son     History   Social History  . Marital  Status: Married    Spouse Name: N/A    Number of Children: N/A  . Years of Education: N/A   Social History Main Topics  . Smoking status: Never Smoker   . Smokeless tobacco: None  . Alcohol Use: No  . Drug Use: No  . Sexual Activity: None   Other Topics Concern  . None   Social History Narrative   Work or School: homemaker, looking for job      Home Situation: lives with husband and two children      Spiritual Beliefs: Christian      Lifestyle: tries to walk dog on a regular basis; diet is so so             Current outpatient prescriptions: hydrocortisone (PROCTOSOL HC) 2.5 % rectal cream, APPLY RECTALLY TWICE A DAY, Disp: 28.35 g, Rfl: 1;  ibuprofen (ADVIL,MOTRIN) 200 MG tablet, Take 200 mg by mouth every 6 (six) hours as needed.  , Disp: , Rfl: ;  lisinopril-hydrochlorothiazide (PRINZIDE,ZESTORETIC) 20-25 MG per tablet, Take 0.5 tablets by mouth daily., Disp: 90 tablet, Rfl: 3 meloxicam (MOBIC) 15 MG tablet, Take 1 tablet (15 mg total) by mouth daily as needed for pain., Disp: 30 tablet, Rfl: 0;  SUMAtriptan (IMITREX) 100 MG tablet, Take 1 tablet (100 mg total) by mouth every 2 (two) hours as needed., Disp: 9 tablet, Rfl: 6  EXAM:  Filed Vitals:   08/17/14 0819  BP: 112/82  Pulse: 60  Temp:  97.7 F (36.5 C)    GENERAL: vitals reviewed and listed below, alert, oriented, appears well hydrated and in no acute distress  HEENT: head atraumatic, PERRLA, normal appearance of eyes, ears, nose and mouth. moist mucus membranes.  NECK: supple, no masses or lymphadenopathy  LUNGS: clear to auscultation bilaterally, no rales, rhonchi or wheeze  CV: HRRR, no peripheral edema or cyanosis, normal pedal pulses  BREAST: does with gyn, declined  ABDOMEN: bowel sounds normal, soft, non tender to palpation, no masses, no rebound or guarding  GU: declined - does with gyn  RECTAL: refused  SKIN: no rash or abnormal lesions  MS: normal gait, moves all extremities  normally Normal inspection of shoulders, arms, neck bilat; normal ROM shoulders, no TTP, normal strength in bilat UEs throughout, neg empty can, neg neers, neg speeds, NV intact distally Normal inspection of bilat LE except for tr bilat ankle edema, normal inspection of R knee, minimal TTP in R upper gastroc tendons, neg val/var stress, neg effusion, neg jt line TTP, neg lachman, neg ant/post drawer  NEURO: CN II-XII grossly intact, normal muscle strength and sensation to light touch on extremities  PSYCH: normal affect, pleasant and cooperative  ASSESSMENT AND PLAN:  Discussed the following assessment and plan:  1. Visit for preventive health examination - Hemoglobin A1c - Lipid Panel -Discussed and advised all [place] preventive services health task force level A and B recommendations for age, sex and risks. -Advised at least 150 minutes of exercise per week and a healthy diet low in saturated fats and sweets and consisting of fresh fruits and vegetables, lean meats such as fish and white chicken and whole grains.  -FASTING labs, studies and vaccines per orders this encounter  2. Hemorrhoids, unspecified hemorrhoid type -refilled medication she used in the past to have on hand in case of flares  3. Right shoulder pain -we discussed potential etiologies after throurough evaluation and likely is a rotator cuff or deltoid strain -she opted for HEP with observation and symptomatic tx as needed and follow up if worsens or persists  4. Right knee pain -we discussed potential etiologies and likely musculoskeletal with gastroc strain likely, seems to be resolving -opted for observation, conservative care and close follow up - ortho referral or if persists or worsens    Orders Placed This Encounter  Procedures  . Flu Vaccine QUAD 36+ mos PF IM (Fluarix Quad PF)  . Hemoglobin A1c  . Lipid Panel    Patient advised to return to clinic immediately if symptoms worsen or persist or new  concerns.  Patient Instructions  BEFORE YOU LEAVE: -labs -flu shot -stool cards -rotator cuff exercises -follow up in 1 month  -We have ordered labs or studies at this visit. It can take up to 1-2 weeks for results and processing. We will contact you with instructions IF your results are abnormal. Normal results will be released to your [facility]. If you have not heard from [place] or can not find your results in [facility] in 2 weeks please contact our office.  -PLEASE SIGN UP FOR MYCHART TODAY   We recommend the following healthy lifestyle measures: - eat a healthy diet consisting of lots of vegetables, fruits, beans, nuts, seeds, healthy meats such as white chicken and fish and whole grains.  - avoid fried foods, fast food, processed foods, sodas, red meet and other fattening foods.  - get a least 150 minutes of aerobic exercise per week.    FOR KNEE PAIN: -call me in  2 weeks if not resolving - no strenuous activities until healed  FOR SHOULDER: -exercises provided -follow up if worsens or becomes persistent    Return in about 1 month (around 09/16/2014) for follow up knee and shoulder.  Colin Benton R.

## 2014-08-17 NOTE — Progress Notes (Signed)
Pre visit review using our clinic review tool, if applicable. No additional management support is needed unless otherwise documented below in the visit note. 

## 2014-08-29 ENCOUNTER — Other Ambulatory Visit (INDEPENDENT_AMBULATORY_CARE_PROVIDER_SITE_OTHER): Payer: BC Managed Care – PPO

## 2014-08-29 ENCOUNTER — Telehealth: Payer: Self-pay | Admitting: Family Medicine

## 2014-08-29 DIAGNOSIS — R195 Other fecal abnormalities: Secondary | ICD-10-CM

## 2014-08-29 LAB — POC HEMOCCULT BLD/STL (HOME/3-CARD/SCREEN)
CARD #2 DATE: NEGATIVE
CARD #3 DATE: NEGATIVE
Card #1 Date: NEGATIVE
FECAL OCCULT BLD: NEGATIVE
FECAL OCCULT BLD: NEGATIVE
FECAL OCCULT BLD: NEGATIVE

## 2014-08-29 NOTE — Telephone Encounter (Signed)
Pt states she did have another question when you get a chance if you could call her.

## 2014-08-29 NOTE — Telephone Encounter (Signed)
Patient informed of the lab test results and I advised her Dr Maudie Mercury recommended she follow up in one month for the knee and shoulder pain.

## 2014-09-16 ENCOUNTER — Encounter: Payer: Self-pay | Admitting: Family Medicine

## 2014-09-16 ENCOUNTER — Ambulatory Visit (INDEPENDENT_AMBULATORY_CARE_PROVIDER_SITE_OTHER): Payer: BC Managed Care – PPO | Admitting: Family Medicine

## 2014-09-16 VITALS — BP 108/72 | HR 64 | Temp 98.2°F | Ht 67.0 in | Wt 182.5 lb

## 2014-09-16 DIAGNOSIS — M25519 Pain in unspecified shoulder: Secondary | ICD-10-CM

## 2014-09-16 DIAGNOSIS — M25569 Pain in unspecified knee: Secondary | ICD-10-CM

## 2014-09-16 DIAGNOSIS — I10 Essential (primary) hypertension: Secondary | ICD-10-CM

## 2014-09-16 MED ORDER — LISINOPRIL 10 MG PO TABS: 10.0000 mg | ORAL_TABLET | Freq: Every day | ORAL | Status: DC

## 2014-09-16 NOTE — Patient Instructions (Addendum)
You opted to work on the exercises for the shoulder on a regular basis  Stop the combo blood pressure medication and only take the lisinopril 10mg  daily - can double to 20mg  daily if needed  Follow up in 4 weeks   Have a Merry Christmas!

## 2014-09-16 NOTE — Progress Notes (Signed)
Pre visit review using our clinic review tool, if applicable. No additional management support is needed unless otherwise documented below in the visit note. 

## 2014-09-16 NOTE — Progress Notes (Addendum)
HPI:  Follow up several problems:  1)R Knee Pain: -started in November a few hours after doing a 5 k - she does not usually run -now reports improving and almost resolved -constant, improving, mild pain now in posterior upper calf  -denies: swelling, clicking, giving away, weakness, numbness, erythema  2)R shoulder pain: -on and off for several months -mild tenderness in post upper R shoulder with certain movements - particularly with abd with ext -she has not done the exercises -denies: weakness, numbness, radiation  3)HTN: -meds: lisinopril-hctz 20-25 0.5 tablets daily -wondering if she can stop the hctz as BP runs low all the time now and the diuretic causes frequent urination -denies: CP, SOB, DOE, Swelling  ROS: See pertinent positives and negatives per HPI.  Past Medical History  Diagnosis Date  . Hearing loss   . Pre-eclampsia   . ZOXWRUEA(540.9)     Past Surgical History  Procedure Laterality Date  . Cholecystectomy    . Hemorrhoid banding      Family History  Problem Relation Age of Onset  . Hyperlipidemia Mother   . Hypertension Mother   . Migraines Mother   . Hyperlipidemia Father   . Hypertension Father   . Heart disease Father 50    MI/ CAD with AFIB  . Migraines Sister   . Migraines Son     History   Social History  . Marital Status: Married    Spouse Name: N/A    Number of Children: N/A  . Years of Education: N/A   Social History Main Topics  . Smoking status: Never Smoker   . Smokeless tobacco: None  . Alcohol Use: No  . Drug Use: No  . Sexual Activity: None   Other Topics Concern  . None   Social History Narrative   Work or School: homemaker, looking for job      Home Situation: lives with husband and two children      Spiritual Beliefs: Christian      Lifestyle: tries to walk dog on a regular basis; diet is so so             Current outpatient prescriptions: hydrocortisone (PROCTOSOL HC) 2.5 % rectal cream, APPLY  RECTALLY TWICE A DAY, Disp: 28.35 g, Rfl: 1;  ibuprofen (ADVIL,MOTRIN) 200 MG tablet, Take 200 mg by mouth every 6 (six) hours as needed.  , Disp: , Rfl: ;  meloxicam (MOBIC) 15 MG tablet, Take 1 tablet (15 mg total) by mouth daily as needed for pain., Disp: 30 tablet, Rfl: 0 SUMAtriptan (IMITREX) 100 MG tablet, Take 1 tablet (100 mg total) by mouth every 2 (two) hours as needed., Disp: 9 tablet, Rfl: 6;  lisinopril (PRINIVIL,ZESTRIL) 10 MG tablet, Take 1 tablet (10 mg total) by mouth daily., Disp: 30 tablet, Rfl: 3  EXAM:  Filed Vitals:   09/16/14 0850  BP: 108/72  Pulse: 64  Temp: 98.2 F (36.8 C)    Body mass index is 28.58 kg/(m^2).  GENERAL: vitals reviewed and listed above, alert, oriented, appears well hydrated and in no acute distress  HEENT: atraumatic, conjunttiva clear, no obvious abnormalities on inspection of external nose and ears  NECK: no obvious masses on inspection  LUNGS: clear to auscultation bilaterally, no wheezes, rales or rhonchi, good air movement  CV: HRRR, no peripheral edema  MS: moves all extremities without noticeable abnormality, gait normal, no sig changes  PSYCH: pleasant and cooperative, no obvious depression or anxiety  ASSESSMENT AND PLAN:  Discussed the following  assessment and plan:  Essential hypertension - Plan: lisinopril (PRINIVIL,ZESTRIL) 10 MG tablet -she is interested in stopping her diuretic due to increased urinary frequency -opted for trial lisinopril alone with recheck BP and BMP in 1 month  Knee pain, unspecified laterality -almost resolved, monitor  Pain in joint, shoulder region, unspecified laterality -query RTC pathology -discussed imaging, referral, formal PT, HEP - she opted to try the exercises with close follow up and referral to ortho if persists  -Patient advised to return or notify a doctor immediately if symptoms worsen or persist or new concerns arise.  Patient Instructions  You opted to work on the  exercises for the shoulder on a regular basis  Follow up in 4 weeks   Stop the combo blood pressure medication and only take the lisinopril 10mg  daily - can double to 20mg  daily if needed       Belinda Mineer R.

## 2014-10-17 ENCOUNTER — Ambulatory Visit (INDEPENDENT_AMBULATORY_CARE_PROVIDER_SITE_OTHER): Payer: BLUE CROSS/BLUE SHIELD | Admitting: Family Medicine

## 2014-10-17 ENCOUNTER — Encounter: Payer: Self-pay | Admitting: Family Medicine

## 2014-10-17 VITALS — BP 112/82 | HR 73 | Temp 98.2°F | Ht 67.25 in | Wt 182.0 lb

## 2014-10-17 DIAGNOSIS — M25561 Pain in right knee: Secondary | ICD-10-CM

## 2014-10-17 DIAGNOSIS — I10 Essential (primary) hypertension: Secondary | ICD-10-CM

## 2014-10-17 DIAGNOSIS — M25511 Pain in right shoulder: Secondary | ICD-10-CM

## 2014-10-17 NOTE — Progress Notes (Signed)
Pre visit review using our clinic review tool, if applicable. No additional management support is needed unless otherwise documented below in the visit note. 

## 2014-10-17 NOTE — Progress Notes (Signed)
HPI:  Follow up:  1)R Knee Pain: -started in November a few hours after doing a 5 k - she does not usually run -now reports improving and almost resolved -constant, improving, mild pain now in posterior upper calf  -denies: swelling, clicking, giving away, weakness, numbness, erythema  2)R shoulder pain: -on and off for several months -mild tenderness in post upper R shoulder with certain movements - particularly with abd with ext -she was doing the exercises and it was improving, then not as good with the exercises -wants to see the orthopedic doctor as is worse at night -denies: weakness, numbness, radiation  3)HTN: -meds: lisinopril 10 tablets daily -wondering if she can stop the hctz as BP runs low all the time now and the diuretic causes frequent urination -denies: CP, SOB, DOE, Swelling   ROS: See pertinent positives and negatives per HPI.  Past Medical History  Diagnosis Date  . Hearing loss   . Pre-eclampsia   . PVVZSMOL(078.6)     Past Surgical History  Procedure Laterality Date  . Cholecystectomy    . Hemorrhoid banding      Family History  Problem Relation Age of Onset  . Hyperlipidemia Mother   . Hypertension Mother   . Migraines Mother   . Hyperlipidemia Father   . Hypertension Father   . Heart disease Father 59    MI/ CAD with AFIB  . Migraines Sister   . Migraines Son     History   Social History  . Marital Status: Married    Spouse Name: N/A    Number of Children: N/A  . Years of Education: N/A   Social History Main Topics  . Smoking status: Never Smoker   . Smokeless tobacco: None  . Alcohol Use: No  . Drug Use: No  . Sexual Activity: None   Other Topics Concern  . None   Social History Narrative   Work or School: homemaker, looking for job      Home Situation: lives with husband and two children      Spiritual Beliefs: Christian      Lifestyle: tries to walk dog on a regular basis; diet is so so             Current  outpatient prescriptions: BLACK COHOSH PO, Take by mouth., Disp: , Rfl: ;  hydrocortisone (PROCTOSOL HC) 2.5 % rectal cream, APPLY RECTALLY TWICE A DAY, Disp: 28.35 g, Rfl: 1;  ibuprofen (ADVIL,MOTRIN) 200 MG tablet, Take 200 mg by mouth every 6 (six) hours as needed.  , Disp: , Rfl: ;  lisinopril (PRINIVIL,ZESTRIL) 10 MG tablet, Take 1 tablet (10 mg total) by mouth daily., Disp: 30 tablet, Rfl: 3 meloxicam (MOBIC) 15 MG tablet, Take 1 tablet (15 mg total) by mouth daily as needed for pain., Disp: 30 tablet, Rfl: 0;  SUMAtriptan (IMITREX) 100 MG tablet, Take 1 tablet (100 mg total) by mouth every 2 (two) hours as needed., Disp: 9 tablet, Rfl: 6  EXAM:  Filed Vitals:   10/17/14 0909  BP: 112/82  Pulse: 73  Temp: 98.2 F (36.8 C)    Body mass index is 28.3 kg/(m^2).  GENERAL: vitals reviewed and listed above, alert, oriented, appears well hydrated and in no acute distress  HEENT: atraumatic, conjunttiva clear, no obvious abnormalities on inspection of external nose and ears  NECK: no obvious masses on inspection  LUNGS: clear to auscultation bilaterally, no wheezes, rales or rhonchi, good air movement  CV: HRRR, no peripheral edema  MS: moves all extremities without noticeable abnormality  PSYCH: pleasant and cooperative, no obvious depression or anxiety  ASSESSMENT AND PLAN:  Discussed the following assessment and plan:  Pain in joint, shoulder region, right - Plan: Ambulatory referral to Orthopedic Surgery -ortho referral  Essential hypertension -continue lisinopril only  Recurrent knee pain, right -resolved  -Patient advised to return or notify a doctor immediately if symptoms worsen or persist or new concerns arise.  There are no Patient Instructions on file for this visit.   Colin Benton R.

## 2014-10-26 ENCOUNTER — Other Ambulatory Visit: Payer: Self-pay | Admitting: Family Medicine

## 2015-01-16 LAB — HM MAMMOGRAPHY

## 2015-01-28 ENCOUNTER — Other Ambulatory Visit: Payer: Self-pay | Admitting: Family Medicine

## 2015-02-02 ENCOUNTER — Encounter: Payer: Self-pay | Admitting: Family Medicine

## 2015-04-03 ENCOUNTER — Other Ambulatory Visit: Payer: Self-pay | Admitting: Family Medicine

## 2015-06-24 ENCOUNTER — Other Ambulatory Visit: Payer: Self-pay | Admitting: Family Medicine

## 2015-07-29 ENCOUNTER — Other Ambulatory Visit: Payer: Self-pay | Admitting: Family Medicine

## 2015-08-27 ENCOUNTER — Other Ambulatory Visit: Payer: Self-pay | Admitting: Family Medicine

## 2015-09-05 ENCOUNTER — Encounter: Payer: Self-pay | Admitting: Family Medicine

## 2015-09-05 NOTE — Telephone Encounter (Signed)
LM for patient to return call. Happy to see her - please see if we can get her on the schedule tomorrow morning? If urgent advise UCC or ED.

## 2015-09-06 NOTE — Telephone Encounter (Signed)
I left a message to check on the pt and to see if she needed to see someone here and to call back if needed.

## 2015-09-28 ENCOUNTER — Other Ambulatory Visit: Payer: Self-pay | Admitting: Family Medicine

## 2015-10-10 ENCOUNTER — Ambulatory Visit (INDEPENDENT_AMBULATORY_CARE_PROVIDER_SITE_OTHER): Payer: 59 | Admitting: Family Medicine

## 2015-10-10 ENCOUNTER — Encounter: Payer: Self-pay | Admitting: Family Medicine

## 2015-10-10 VITALS — BP 110/80 | HR 79 | Temp 98.2°F | Ht 67.25 in | Wt 184.3 lb

## 2015-10-10 DIAGNOSIS — M25561 Pain in right knee: Secondary | ICD-10-CM | POA: Diagnosis not present

## 2015-10-10 DIAGNOSIS — E785 Hyperlipidemia, unspecified: Secondary | ICD-10-CM | POA: Diagnosis not present

## 2015-10-10 DIAGNOSIS — R51 Headache: Secondary | ICD-10-CM | POA: Diagnosis not present

## 2015-10-10 DIAGNOSIS — I10 Essential (primary) hypertension: Secondary | ICD-10-CM

## 2015-10-10 DIAGNOSIS — N951 Menopausal and female climacteric states: Secondary | ICD-10-CM

## 2015-10-10 DIAGNOSIS — R232 Flushing: Secondary | ICD-10-CM

## 2015-10-10 DIAGNOSIS — R519 Headache, unspecified: Secondary | ICD-10-CM

## 2015-10-10 LAB — BASIC METABOLIC PANEL
BUN: 16 mg/dL (ref 6–23)
CALCIUM: 10.2 mg/dL (ref 8.4–10.5)
CHLORIDE: 104 meq/L (ref 96–112)
CO2: 29 meq/L (ref 19–32)
CREATININE: 0.89 mg/dL (ref 0.40–1.20)
GFR: 70.86 mL/min (ref >60.00)
Glucose, Bld: 108 mg/dL — ABNORMAL HIGH (ref 70–99)
Potassium: 4 mEq/L (ref 3.5–5.1)
SODIUM: 141 meq/L (ref 135–145)

## 2015-10-10 LAB — CHOLESTEROL, TOTAL: CHOLESTEROL: 200 mg/dL (ref 0–200)

## 2015-10-10 LAB — HDL CHOLESTEROL: HDL: 49.1 mg/dL (ref >39.00)

## 2015-10-10 MED ORDER — SUMATRIPTAN SUCCINATE 100 MG PO TABS: ORAL_TABLET | ORAL | Status: DC

## 2015-10-10 MED ORDER — LISINOPRIL 10 MG PO TABS: 10.0000 mg | ORAL_TABLET | Freq: Every day | ORAL | Status: DC

## 2015-10-10 NOTE — Progress Notes (Signed)
HPI:  Follow up:  HTN: -meds: lisinopril 10mg  -stable  BRBPR: -occurred several weeks ago for a few days with irritation of rectum -reports due to hemorrhoids -sees surgeon in [place] for this - agrees to contact her surgeon for this and to schedule past due colonoscopy  R knee pain: -for a month or two -hurts with stairs, squats and sitting for long periods when driving -denies: weakness, numbness, giving away, locking, swelling, erythema  Hot flashes: -seeing gyn and did not tolerate HRT or clonidine -toleratble on black cohosh -wonders what other options are available  Migrianes: -needs refill on triptan -stable   ROS: See pertinent positives and negatives per HPI.  Past Medical History  Diagnosis Date  . Hearing loss   . Pre-eclampsia   . [id number])     Past Surgical History  Procedure Laterality Date  . Cholecystectomy    . Hemorrhoid banding      Family History  Problem Relation Age of Onset  . Hyperlipidemia Mother   . Hypertension Mother   . Migraines Mother   . Hyperlipidemia Father   . Hypertension Father   . Heart disease Father 53    MI/ CAD with AFIB  . Migraines Sister   . Migraines Son     Social History   Social History  . Marital Status: Married    Spouse Name: N/A  . Number of Children: N/A  . Years of Education: N/A   Social History Main Topics  . Smoking status: Never Smoker   . Smokeless tobacco: None  . Alcohol Use: No  . Drug Use: No  . Sexual Activity: Not Asked   Other Topics Concern  . None   Social History Narrative   Work or School: homemaker, looking for job      Home Situation: lives with husband and two children      Spiritual Beliefs: Christian      Lifestyle: tries to walk dog on a regular basis; diet is so so              Current outpatient prescriptions:  .  ALFALFA PO, Take by mouth., Disp: , Rfl:  .  BLACK COHOSH PO, Take by mouth., Disp: , Rfl:  .  hydrocortisone (PROCTOSOL HC)  2.5 % rectal cream, APPLY RECTALLY TWICE A DAY, Disp: 28.35 g, Rfl: 1 .  ibuprofen (ADVIL,MOTRIN) 200 MG tablet, Take 200 mg by mouth every 6 (six) hours as needed.  , Disp: , Rfl:  .  lisinopril (PRINIVIL,ZESTRIL) 10 MG tablet, Take 1 tablet (10 mg total) by mouth daily., Disp: 90 tablet, Rfl: 3 .  meloxicam (MOBIC) 15 MG tablet, TAKE 1 TABLET (15 MG TOTAL) BY MOUTH DAILY AS NEEDED FOR PAIN., Disp: 30 tablet, Rfl: 0 .  SUMAtriptan (IMITREX) 100 MG tablet, TAKE 1 TABLET (100 MG TOTAL) BY MOUTH EVERY 2 (TWO) HOURS AS NEEDED., Disp: 9 tablet, Rfl: 5  EXAM:  Filed Vitals:   10/10/15 0929  BP: 110/80  Pulse: 79  Temp: 98.2 F (36.8 C)    Body mass index is 28.66 kg/(m^2).  GENERAL: vitals reviewed and listed above, alert, oriented, appears well hydrated and in no acute distress  HEENT: atraumatic, conjunttiva clear, no obvious abnormalities on inspection of external nose and ears  NECK: no obvious masses on inspection  LUNGS: clear to auscultation bilaterally, no wheezes, rales or rhonchi, good air movement  CV: HRRR, no peripheral edema  MS: moves all extremities without noticeable abnormality -knee caps mildly lateral -no  swelling or erythema -TTP around knee cap R with patella crepitus -neg lachman, neg drawer, neg mcmurry, neg valg/var stress -nv intact distal  PSYCH: pleasant and cooperative, no obvious depression or anxiety  ASSESSMENT AND PLAN:  Discussed the following assessment and plan:  Essential hypertension - Plan: Basic metabolic panel  Nonintractable headache, unspecified chronicity pattern, unspecified headache type  Hyperlipemia - Plan: Cholesterol, Total, HDL cholesterol  Recurrent knee pain, right  Hot flashes  -NON-fasting labs -refilled meds -HEP for knee pain, suspect PFS - discussed other etiologies and advised follow up if persists -discussed tx options for hot flashes, she may consider effexor but is concerned about potential wt gain with  this -advised follow up with surgeon or with GI for the rectal bleeding and for her colonoscopy - she agreed to call if opts for GI referral -Patient advised to return or notify a doctor immediately if symptoms worsen or persist or new concerns arise.  Patient Instructions  BEFORE YOU LEAVE: -PFS knee exercises -lab -follow up in 3 months  Do the exercises for the knee at least 4 days per week  We recommend the following healthy lifestyle measures: - eat a healthy whole foods diet consisting of regular small meals composed of vegetables, fruits, beans, nuts, seeds, healthy meats such as white chicken and fish and whole grains.  - avoid sweets, white starchy foods, fried foods, fast food, processed foods, sodas, red meet and other fattening foods.  - get a least 150-300 minutes of aerobic exercise per week.   -We have ordered labs or studies at this visit. It can take up to 1-2 weeks for results and processing. We will contact you with instructions IF your results are abnormal. Normal results will be released to your [facility]. If you have not heard from [place] or can not find your results in [facility] in 2 weeks please contact our office.            Colin Benton R.

## 2015-10-10 NOTE — Patient Instructions (Signed)
BEFORE YOU LEAVE: -PFS knee exercises -lab -follow up in 3 months  Do the exercises for the knee at least 4 days per week  We recommend the following healthy lifestyle measures: - eat a healthy whole foods diet consisting of regular small meals composed of vegetables, fruits, beans, nuts, seeds, healthy meats such as white chicken and fish and whole grains.  - avoid sweets, white starchy foods, fried foods, fast food, processed foods, sodas, red meet and other fattening foods.  - get a least 150-300 minutes of aerobic exercise per week.   -We have ordered labs or studies at this visit. It can take up to 1-2 weeks for results and processing. We will contact you with instructions IF your results are abnormal. Normal results will be released to your [facility]. If you have not heard from [place] or can not find your results in [facility] in 2 weeks please contact our office.

## 2015-10-10 NOTE — Progress Notes (Signed)
Pre visit review using our clinic review tool, if applicable. No additional management support is needed unless otherwise documented below in the visit note. 

## 2016-01-11 ENCOUNTER — Ambulatory Visit (INDEPENDENT_AMBULATORY_CARE_PROVIDER_SITE_OTHER): Payer: PRIVATE HEALTH INSURANCE | Admitting: Family Medicine

## 2016-01-11 ENCOUNTER — Encounter: Payer: Self-pay | Admitting: Family Medicine

## 2016-01-11 VITALS — BP 112/80 | HR 73 | Temp 98.8°F | Ht 67.25 in | Wt 183.6 lb

## 2016-01-11 DIAGNOSIS — H9190 Unspecified hearing loss, unspecified ear: Secondary | ICD-10-CM | POA: Diagnosis not present

## 2016-01-11 DIAGNOSIS — E785 Hyperlipidemia, unspecified: Secondary | ICD-10-CM

## 2016-01-11 DIAGNOSIS — R232 Flushing: Secondary | ICD-10-CM

## 2016-01-11 DIAGNOSIS — N951 Menopausal and female climacteric states: Secondary | ICD-10-CM | POA: Diagnosis not present

## 2016-01-11 DIAGNOSIS — I10 Essential (primary) hypertension: Secondary | ICD-10-CM | POA: Diagnosis not present

## 2016-01-11 DIAGNOSIS — K649 Unspecified hemorrhoids: Secondary | ICD-10-CM | POA: Insufficient documentation

## 2016-01-11 HISTORY — DX: Hyperlipidemia, unspecified: E78.5

## 2016-01-11 HISTORY — DX: Unspecified hemorrhoids: K64.9

## 2016-01-11 HISTORY — DX: Flushing: R23.2

## 2016-01-11 NOTE — Progress Notes (Signed)
HPI:  HTN: -meds: lisinopril 10mg  -stable  Hyperlipidemia: -lifestyle recs advised -reports is exercising, diet ok but not great -NOT fasting today  Hot flashes: -seeing gyn and did not tolerate HRT or clonidine -toleratble on black cohosh -wonders what other options are available  ROS: See pertinent positives and negatives per HPI.  Past Medical History  Diagnosis Date  . Hearing loss   . Pre-eclampsia   . Headache(784.0)   . Hypertension 04/19/2010    Qualifier: Diagnosis of  By: Leanne Chang MD, Bruce     . Hemorrhoid 01/11/2016    -sees surgeon in [place]   . Hot flashes 01/11/2016    -seeing gynecologist   . Headache 02/03/2008    Qualifier: Diagnosis of  By: Leanne Chang MD, Bruce  Menstrual related   . Hyperlipemia 01/11/2016    Past Surgical History  Procedure Laterality Date  . Cholecystectomy    . Hemorrhoid banding      Family History  Problem Relation Age of Onset  . Hyperlipidemia Mother   . Hypertension Mother   . Migraines Mother   . Hyperlipidemia Father   . Hypertension Father   . Heart disease Father 17    MI/ CAD with AFIB  . Migraines Sister   . Migraines Son     Social History   Social History  . Marital Status: Married    Spouse Name: N/A  . Number of Children: N/A  . Years of Education: N/A   Social History Main Topics  . Smoking status: Never Smoker   . Smokeless tobacco: None  . Alcohol Use: No  . Drug Use: No  . Sexual Activity: Not Asked   Other Topics Concern  . None   Social History Narrative   Work or School: homemaker, looking for job      Home Situation: lives with husband and two children      Spiritual Beliefs: Christian      Lifestyle: tries to walk dog on a regular basis; diet is so so              Current outpatient prescriptions:  .  ALFALFA PO, Take by mouth., Disp: , Rfl:  .  BLACK COHOSH PO, Take by mouth., Disp: , Rfl:  .  hydrocortisone (PROCTOSOL HC) 2.5 % rectal cream, APPLY RECTALLY TWICE A DAY,  Disp: 28.35 g, Rfl: 1 .  ibuprofen (ADVIL,MOTRIN) 200 MG tablet, Take 200 mg by mouth every 6 (six) hours as needed.  , Disp: , Rfl:  .  lisinopril (PRINIVIL,ZESTRIL) 10 MG tablet, Take 1 tablet (10 mg total) by mouth daily., Disp: 90 tablet, Rfl: 3 .  meloxicam (MOBIC) 15 MG tablet, TAKE 1 TABLET (15 MG TOTAL) BY MOUTH DAILY AS NEEDED FOR PAIN., Disp: 30 tablet, Rfl: 0 .  SUMAtriptan (IMITREX) 100 MG tablet, TAKE 1 TABLET (100 MG TOTAL) BY MOUTH EVERY 2 (TWO) HOURS AS NEEDED., Disp: 9 tablet, Rfl: 5  EXAM:  Filed Vitals:   01/11/16 0803  BP: 112/80  Pulse: 73  Temp: 98.8 F (37.1 C)    Body mass index is 28.55 kg/(m^2).  GENERAL: vitals reviewed and listed above, alert, oriented, appears well hydrated and in no acute distress  HEENT: atraumatic, conjunttiva clear, no obvious abnormalities on inspection of external nose and ears  NECK: no obvious masses on inspection  LUNGS: clear to auscultation bilaterally, no wheezes, rales or rhonchi, good air movement  CV: HRRR, no peripheral edema  MS: moves all extremities without noticeable abnormality  PSYCH:  pleasant and cooperative, no obvious depression or anxiety  ASSESSMENT AND PLAN:  Discussed the following assessment and plan:  Essential hypertension  Hearing loss, unspecified laterality  Hyperlipemia  Hot flashes  -Patient advised to return or notify a doctor immediately if symptoms worsen or persist or new concerns arise.  Patient Instructions  BEFORE YOU LEAVE: -schedule Annual Exam in about 3-4 months; come FASTING and we will plan to do labs that day  Please schedule your mammogram  Please let us know if you would like to do the cologuard test for coon cancer screening.  We recommend the following healthy lifestyle measures: - eat a healthy whole foods diet consisting of regular small meals composed of vegetables, fruits, beans, nuts, seeds, healthy meats such as white chicken and fish and whole grains.  -  avoid sweets, white starchy foods, fried foods, fast food, processed foods, sodas, red meet and other fattening foods.  - get a least 150-300 minutes of aerobic exercise per week.       Colin Benton R.

## 2016-01-11 NOTE — Progress Notes (Signed)
Pre visit review using our clinic review tool, if applicable. No additional management support is needed unless otherwise documented below in the visit note. 

## 2016-01-11 NOTE — Patient Instructions (Addendum)
BEFORE YOU LEAVE: -schedule Annual Exam in about 3-4 months; come FASTING and we will plan to do labs that day  Please schedule your mammogram  Please let us know if you would like to do the cologuard test for coon cancer screening.  We recommend the following healthy lifestyle measures: - eat a healthy whole foods diet consisting of regular small meals composed of vegetables, fruits, beans, nuts, seeds, healthy meats such as white chicken and fish and whole grains.  - avoid sweets, white starchy foods, fried foods, fast food, processed foods, sodas, red meet and other fattening foods.  - get a least 150-300 minutes of aerobic exercise per week.

## 2016-01-25 ENCOUNTER — Other Ambulatory Visit: Payer: Self-pay | Admitting: *Deleted

## 2016-01-25 ENCOUNTER — Encounter: Payer: Self-pay | Admitting: Family Medicine

## 2016-01-25 MED ORDER — LISINOPRIL 10 MG PO TABS: 10.0000 mg | ORAL_TABLET | Freq: Every day | ORAL | Status: DC

## 2016-01-25 NOTE — Telephone Encounter (Signed)
Rx done and the pt was informed via Mychart message. 

## 2016-05-20 NOTE — Progress Notes (Signed)
HPI:  Here for CPE:  -Concerns and/or follow up today:   New issues:  R leg pain: -for several months -does not remember trauma or inciting event -pain in entire lower leg below knee, occ pain R buttock -pain worse with driving and walking -denies: weakness, numbness, back pain, fevers, malaise -some bilat LE edema -tried PT, acupuncture and chiropractic -had plain films of her back - ok per her report  Irr menstrual bleeding: -spotting -seeing gyn for this later this week  Occ dry itchy skin: -used steroid cream in the past for this -requests triam cream  Chronic conditions:  HTN: -meds: lisinopril 10mg  - needs refill -stable  Hyperlipidemia: -lifestyle recs advised -reports is exercising, diet ok but not great  Hot flashes: -seeing gyn and did not tolerate HRT or clonidine -toleratble on black cohosh -wonders what other options are available  -Taking folic acid, vitamin D or calcium: no  -Diabetes and Dyslipidemia Screening:  -Hx of HTN: no  -Vaccines: UTD  -pap history: sees gyn  -wants STI testing (Hep C if born [birth year]): no  -FH breast, colon or ovarian ca: see FH Last mammogram: Last colon cancer screening:  Breast Ca Risk Assessment:  -Alcohol, Tobacco, drug use: see social history  Review of Systems - no fevers, unintentional weight loss, vision loss, hearing loss, chest pain, sob, hemoptysis, melena, hematochezia, hematuria, genital discharge, changing or concerning skin lesions, bleeding, bruising, loc, thoughts of self harm or SI  Past Medical History:  Diagnosis Date  . Headache 02/03/2008   Qualifier: Diagnosis of  By: Leanne Chang MD, Bruce  Menstrual related   . Headache(784.0)   . Hearing loss   . Hemorrhoid 01/11/2016   -sees surgeon in [place]   . Hot flashes 01/11/2016   -seeing gynecologist   . Hyperlipemia 01/11/2016  . Hypertension 04/19/2010   Qualifier: Diagnosis of  By: Leanne Chang MD, Bruce     . Pre-eclampsia     Past  Surgical History:  Procedure Laterality Date  . CHOLECYSTECTOMY    . hemorrhoid banding      Family History  Problem Relation Age of Onset  . Hyperlipidemia Mother   . Hypertension Mother   . Migraines Mother   . Hyperlipidemia Father   . Hypertension Father   . Heart disease Father 42    MI/ CAD with AFIB  . Migraines Sister   . Migraines Son     Social History   Social History  . Marital status: Married    Spouse name: N/A  . Number of children: N/A  . Years of education: N/A   Social History Main Topics  . Smoking status: Never Smoker  . Smokeless tobacco: None  . Alcohol use No  . Drug use: No  . Sexual activity: Not Asked   Other Topics Concern  . None   Social History Narrative   Work or School: homemaker, looking for job      Home Situation: lives with husband and two children      Spiritual Beliefs: Christian      Lifestyle: tries to walk dog on a regular basis; diet is so so              Current Outpatient Prescriptions:  .  hydrocortisone (PROCTOSOL HC) 2.5 % rectal cream, APPLY RECTALLY TWICE A DAY, Disp: 28.35 g, Rfl: 1 .  ibuprofen (ADVIL,MOTRIN) 200 MG tablet, Take 200 mg by mouth every 6 (six) hours as needed.  , Disp: , Rfl:  .  lisinopril (  PRINIVIL,ZESTRIL) 10 MG tablet, Take 1 tablet (10 mg total) by mouth daily., Disp: 90 tablet, Rfl: 3 .  meloxicam (MOBIC) 15 MG tablet, TAKE 1 TABLET (15 MG TOTAL) BY MOUTH DAILY AS NEEDED FOR PAIN., Disp: 30 tablet, Rfl: 0 .  NON FORMULARY, Hormonal supplement with Soy and Black Cohosh, Disp: , Rfl:  .  SUMAtriptan (IMITREX) 100 MG tablet, TAKE 1 TABLET (100 MG TOTAL) BY MOUTH EVERY 2 (TWO) HOURS AS NEEDED., Disp: 9 tablet, Rfl: 5 .  cyclobenzaprine (FLEXERIL) 5 MG tablet, Take 1 tablet (5 mg total) by mouth 3 (three) times daily as needed for muscle spasms., Disp: 30 tablet, Rfl: 1 .  triamcinolone cream (KENALOG) 0.1 %, Apply 1 application topically 2 (two) times daily., Disp: 30 g, Rfl:  0  EXAM:  Vitals:   05/21/16 0800  BP: 120/80  Pulse: 82  Temp: 98.4 F (36.9 C)    GENERAL: vitals reviewed and listed below, alert, oriented, appears well hydrated and in no acute distress  HEENT: head atraumatic, PERRLA, normal appearance of eyes, ears, nose and mouth. moist mucus membranes.  NECK: supple, no masses or lymphadenopathy  LUNGS: clear to auscultation bilaterally, no rales, rhonchi or wheeze  CV: HRRR, no peripheral edema or cyanosis, normal pedal pulses  BREAST: declined, does with gyn  ABDOMEN: bowel sounds normal, soft, non tender to palpation, no masses, no rebound or guarding  GU: declined, does with gyn  SKIN: no rash or abnormal lesions  MS: normal gait, moves all extremities normally; TTP tib ant R leg, tr bilat LE edema, no bony TTP back/sacrum, no piriformis muscle TTP, normal strength, sensitivity to touch and DTRS in LE bilat, neg SLRT/CLRT/FABER, +FADIR on R, neg facet loading  NEURO: normal gait, speech and thought processing grossly intact, muscle tone grossly intact throughout  PSYCH: normal affect, pleasant and cooperative  ASSESSMENT AND PLAN:  Discussed the following assessment and plan:  1. Encounter for preventive measure -Discussed and advised all [place] preventive services health task force level A and B recommendations for age, sex and risks. -flu shot in fall -breast and gyn care with her gynecologist -Advised at least 150 minutes of exercise per week and a healthy diet with avoidance of (less then 1 serving per week) processed foods, white starches, red meat, fast foods and sweets and consisting of: * 5-9 servings of fresh fruits and vegetables (not corn or potatoes) *nuts and seeds, beans *olives and olive oil *lean meats such as fish and white chicken  *whole grains -labs, studies and vaccines per orders this encounter - Hemoglobin A1c - Hep C Antibody  2. Right leg pain -query tib ant strain vs piriformis syndrome  -we  discussed possible serious and likely etiologies, workup and treatment, treatment risks and return precautions -after this discussion, Zanaya opted for : - VAS [place] LOWER EXTREMITY VENOUS (DVT); Future to r/u DVT - cyclobenzaprine (FLEXERIL) 5 MG tablet; Take 1 tablet (5 mg total) by mouth 3 (three) times daily as needed for muscle spasms.  Dispense: 30 tablet; Refill: 1 -HEP, donut pillow -f/u 1 month -of course, we advised Camillia  to return or notify a doctor immediately if symptoms worsen or persist or new concerns arise.  3. Essential hypertension -refilled medication - Basic metabolic panel - CBC with Differential/Platelets  4. Nonintractable headache, unspecified chronicity pattern, unspecified headache type -refilled medication  5. Hyperlipemia -refilled medication - Lipid Panel  6. DUB: -seeing gyn this week for this  Orders Placed This Encounter  Procedures  . Lipid Panel  . Hemoglobin A1c  . Basic metabolic panel  . CBC with Differential/Platelets  . Hep C Antibody    Patient advised to return to clinic immediately if symptoms worsen or persist or new concerns.  Patient Instructions  BEFORE YOU LEAVE: -piriformis syndrome exercises, tibialis anterior exercises (if available) -follow up: 1 month for leg pain -labs  Call your insurance about the cologuard - then let us know so that we can order.  Schedule your mammogram.  Vit D3 1000 IU daily  Muscle relaxer at night for 1 week and as needed per instructions.  Do the exercises provided 4 days per week.  Consider gait analysis at [facility] and change in footwear to help with knee deformity.  Donut pillow for driving.  -We placed a referral for you as discussed for the ultrasound. It usually takes about 1-2 weeks to process and schedule this referral. If you have not heard from [place] regarding this appointment in 2 weeks please contact our office.  Follow up with your gynecologist for yearly exam.  We recommend  the following healthy lifestyle: 1) Small portions - eat off of salad plate instead of dinner plate 2) Eat a healthy clean diet with avoidance of (less then 1 serving per week) processed foods, sweetened drinks, white starches, red meat, fast foods and sweets and consisting of: * 5-9 servings per day of fresh or frozen fruits and vegetables (not corn or potatoes, not dried or canned) *nuts and seeds, beans *olives and olive oil *small portions of lean meats such as fish and white chicken  *small portions of whole grains 3)Get at least 150 minutes of sweaty aerobic exercise per week 4)reduce stress - counseling, meditation, relaxation to balance other aspects of your life  We have ordered labs or studies at this visit. It can take up to 1-2 weeks for results and processing. IF results require follow up or explanation, we will call you with instructions. Clinically stable results will be released to your [facility]. If you have not heard from [place] or cannot find your results in [facility] in 2 weeks please contact our office at [phone].  If you are not yet signed up for [facility], please consider signing up.           No Follow-up on file.  Colin Benton R., DO

## 2016-05-21 ENCOUNTER — Encounter: Payer: Self-pay | Admitting: Family Medicine

## 2016-05-21 ENCOUNTER — Encounter: Payer: PRIVATE HEALTH INSURANCE | Admitting: Family Medicine

## 2016-05-21 ENCOUNTER — Ambulatory Visit (INDEPENDENT_AMBULATORY_CARE_PROVIDER_SITE_OTHER): Payer: PRIVATE HEALTH INSURANCE | Admitting: Family Medicine

## 2016-05-21 VITALS — BP 120/80 | HR 82 | Temp 98.4°F | Ht 67.0 in | Wt 186.7 lb

## 2016-05-21 DIAGNOSIS — R51 Headache: Secondary | ICD-10-CM

## 2016-05-21 DIAGNOSIS — I10 Essential (primary) hypertension: Secondary | ICD-10-CM

## 2016-05-21 DIAGNOSIS — M79604 Pain in right leg: Secondary | ICD-10-CM

## 2016-05-21 DIAGNOSIS — Z0000 Encounter for general adult medical examination without abnormal findings: Secondary | ICD-10-CM | POA: Diagnosis not present

## 2016-05-21 DIAGNOSIS — R519 Headache, unspecified: Secondary | ICD-10-CM

## 2016-05-21 DIAGNOSIS — E785 Hyperlipidemia, unspecified: Secondary | ICD-10-CM

## 2016-05-21 DIAGNOSIS — Z299 Encounter for prophylactic measures, unspecified: Secondary | ICD-10-CM

## 2016-05-21 LAB — CBC WITH DIFFERENTIAL/PLATELET
BASOS ABS: 0 10*3/uL (ref 0.0–0.1)
Basophils Relative: 0.6 % (ref 0.0–3.0)
EOS ABS: 0.1 10*3/uL (ref 0.0–0.7)
Eosinophils Relative: 2.6 % (ref 0.0–5.0)
HCT: 38.5 % (ref 36.0–46.0)
Hemoglobin: 13.3 g/dL (ref 12.0–15.0)
LYMPHS ABS: 1.8 10*3/uL (ref 0.7–4.0)
Lymphocytes Relative: 33 % (ref 12.0–46.0)
MCHC: 34.5 g/dL (ref 30.0–36.0)
MCV: 92.9 fl (ref 78.0–100.0)
MONOS PCT: 8.5 % (ref 3.0–12.0)
Monocytes Absolute: 0.5 10*3/uL (ref 0.1–1.0)
NEUTROS ABS: 3.1 10*3/uL (ref 1.4–7.7)
NEUTROS PCT: 55.3 % (ref 43.0–77.0)
PLATELETS: 279 10*3/uL (ref 150.0–400.0)
RBC: 4.14 Mil/uL (ref 3.87–5.11)
RDW: 13.3 % (ref 11.5–15.5)
WBC: 5.6 10*3/uL (ref 4.0–10.5)

## 2016-05-21 LAB — BASIC METABOLIC PANEL
BUN: 13 mg/dL (ref 6–23)
CALCIUM: 9.8 mg/dL (ref 8.4–10.5)
CO2: 26 meq/L (ref 19–32)
CREATININE: 0.85 mg/dL (ref 0.40–1.20)
Chloride: 105 mEq/L (ref 96–112)
GFR: 74.54 mL/min (ref >60.00)
GLUCOSE: 90 mg/dL (ref 70–99)
Potassium: 4 mEq/L (ref 3.5–5.1)
Sodium: 138 mEq/L (ref 135–145)

## 2016-05-21 LAB — LIPID PANEL
CHOLESTEROL: 179 mg/dL (ref 0–200)
HDL: 47.1 mg/dL (ref >39.00)
LDL CALC: 100 mg/dL — AB (ref 0–99)
NonHDL: 131.58
TRIGLYCERIDES: 158 mg/dL — AB (ref 0.0–149.0)
Total CHOL/HDL Ratio: 4
VLDL: 31.6 mg/dL (ref 0.0–40.0)

## 2016-05-21 LAB — HEMOGLOBIN A1C: Hgb A1c MFr Bld: 5.5 % (ref 4.6–6.5)

## 2016-05-21 MED ORDER — SUMATRIPTAN SUCCINATE 100 MG PO TABS: ORAL_TABLET | ORAL | 5 refills | Status: DC

## 2016-05-21 MED ORDER — MELOXICAM 15 MG PO TABS: ORAL_TABLET | ORAL | 0 refills | Status: DC

## 2016-05-21 MED ORDER — LISINOPRIL 10 MG PO TABS: 10.0000 mg | ORAL_TABLET | Freq: Every day | ORAL | 3 refills | Status: DC

## 2016-05-21 MED ORDER — TRIAMCINOLONE ACETONIDE 0.1 % EX CREA: 1.0000 "application " | TOPICAL_CREAM | Freq: Two times a day (BID) | CUTANEOUS | 0 refills | Status: DC

## 2016-05-21 MED ORDER — CYCLOBENZAPRINE HCL 5 MG PO TABS: 5.0000 mg | ORAL_TABLET | Freq: Three times a day (TID) | ORAL | 1 refills | Status: DC | PRN

## 2016-05-21 NOTE — Patient Instructions (Signed)
BEFORE YOU LEAVE: -piriformis syndrome exercises, tibialis anterior exercises (if available) -follow up: 1 month for leg pain -labs  Call your insurance about the cologuard - then let us know so that we can order.  Schedule your mammogram.  Vit D3 1000 IU daily  Muscle relaxer at night for 1 week and as needed per instructions.  Do the exercises provided 4 days per week.  Consider gait analysis at [facility] and change in footwear to help with knee deformity.  Donut pillow for driving.  -We placed a referral for you as discussed for the ultrasound. It usually takes about 1-2 weeks to process and schedule this referral. If you have not heard from [place] regarding this appointment in 2 weeks please contact our office.  Follow up with your gynecologist for yearly exam.  We recommend the following healthy lifestyle: 1) Small portions - eat off of salad plate instead of dinner plate 2) Eat a healthy clean diet with avoidance of (less then 1 serving per week) processed foods, sweetened drinks, white starches, red meat, fast foods and sweets and consisting of: * 5-9 servings per day of fresh or frozen fruits and vegetables (not corn or potatoes, not dried or canned) *nuts and seeds, beans *olives and olive oil *small portions of lean meats such as fish and white chicken  *small portions of whole grains 3)Get at least 150 minutes of sweaty aerobic exercise per week 4)reduce stress - counseling, meditation, relaxation to balance other aspects of your life  We have ordered labs or studies at this visit. It can take up to 1-2 weeks for results and processing. IF results require follow up or explanation, we will call you with instructions. Clinically stable results will be released to your [facility]. If you have not heard from [place] or cannot find your results in [facility] in 2 weeks please contact our office at [phone].  If you are not yet signed up for [facility], please consider signing  up.

## 2016-05-21 NOTE — Progress Notes (Signed)
Pre visit review using our clinic review tool, if applicable. No additional management support is needed unless otherwise documented below in the visit note. 

## 2016-05-22 LAB — HEPATITIS C ANTIBODY: HCV AB: NEGATIVE

## 2016-05-23 ENCOUNTER — Telehealth: Payer: Self-pay | Admitting: Family Medicine

## 2016-05-23 ENCOUNTER — Ambulatory Visit (HOSPITAL_COMMUNITY)
Admission: RE | Admit: 2016-05-23 | Discharge: 2016-05-23 | Disposition: A | Discharge status: Home / Self Care | Payer: PRIVATE HEALTH INSURANCE | Source: Ambulatory Visit | Attending: Cardiology | Admitting: Cardiology

## 2016-05-23 DIAGNOSIS — I10 Essential (primary) hypertension: Secondary | ICD-10-CM | POA: Insufficient documentation

## 2016-05-23 DIAGNOSIS — E785 Hyperlipidemia, unspecified: Secondary | ICD-10-CM | POA: Insufficient documentation

## 2016-05-23 DIAGNOSIS — M7989 Other specified soft tissue disorders: Secondary | ICD-10-CM | POA: Diagnosis not present

## 2016-05-23 DIAGNOSIS — M79604 Pain in right leg: Secondary | ICD-10-CM | POA: Insufficient documentation

## 2016-05-23 NOTE — Telephone Encounter (Signed)
Pt is returning Belinda Maxwell call °

## 2016-05-24 ENCOUNTER — Telehealth: Payer: Self-pay | Admitting: *Deleted

## 2016-05-24 DIAGNOSIS — M79604 Pain in right leg: Secondary | ICD-10-CM

## 2016-05-24 NOTE — Telephone Encounter (Signed)
I called the pt and informed her of the results.  She wanted to know if she could see Dr Tamala Julian now instead of waiting for one month to follow up due to the leg pain?  She is aware this will be addressed on Monday when Dr Maudie Mercury returns to the office.

## 2016-05-24 NOTE — Telephone Encounter (Signed)
I left a message for the pt to return my call. 

## 2016-05-24 NOTE — Telephone Encounter (Signed)
-----   Message from Lucretia Kern, DO sent at 05/23/2016 12:47 PM EDT ----- Regarding: FW: Patient results Please let pt know. ----- Message ----- From: Illene Silver Sent: 05/23/2016   8:19 AM To: Lucretia Kern, DO Subject: Patient results                                Patient is negative for right leg thrombus.

## 2016-05-27 NOTE — Telephone Encounter (Signed)
8/11-see results note.

## 2016-05-27 NOTE — Telephone Encounter (Signed)
Ok to refer.

## 2016-05-27 NOTE — Telephone Encounter (Signed)
Referral ordered

## 2016-06-06 LAB — HM PAP SMEAR

## 2016-06-10 NOTE — Progress Notes (Signed)
[facility] [place], [place] [address] Phone: [phone] Subjective:    I'm seeing this patient by the request  of:  Colin Benton R., DO   CC: Right leg pain.  [id number]  Belinda Maxwell is a 50 y.o. female coming in with complaint of right leg pain. Going on for approximately 1 month. Patient states This has actually been going on maybe 3 months. Does not remember joint injury. Patient states that many years ago had a very similar presentation. Patient states that it continues to have discomfort that seems to be oriented around the right knee and then radiates distally. Does have a past medical history significant for lumbar spine pain as well. Patient did have a fall a years ago. Patient describes it as more of a dull, throbbing aching pain. Sometimes feels that she is unable to move her knee in certain positions. Denies any locking or giving out. Rates the severity of pain as 6 out of 10. More frustrating. It does not seem to be improving. Has tried over-the-counter medications with no significant improvement.     Past Medical History:  Diagnosis Date  . Headache 02/03/2008   Qualifier: Diagnosis of  By: Leanne Chang MD, Bruce  Menstrual related   . Headache(784.0)   . Hearing loss   . Hemorrhoid 01/11/2016   -se[place] surgeon in [place]   . Hot flashes 01/11/2016   -seeing gynecologist   . Hyperlipemia 01/11/2016  . Hypertension 04/19/2010   Qualifier: Diagnosis of  By: Leanne Chang MD, Bruce     . Pre-eclampsia    Past Surgical History:  Procedure Laterality Date  . CHOLECYSTECTOMY    . hemorrhoid banding     Social History   Social History  . Marital status: Married    Spouse name: N/A  . Number of children: N/A  . Years of education: N/A   Social History Main Topics  . Smoking status: Never Smoker  . Smokeless tobacco: None  . Alcohol use No  . Drug use: No  . Sexual activity: Not Asked   Other Topics Concern  . None   Social  History Narrative   Work or School: homemaker, looking for job      Home Situation: lives with husband and two children      Spiritual Beliefs: Christian      Lifestyle: tries to walk dog on a regular basis; diet is so so            Allergies  Allergen Reactions  . Sulfamethoxazole     REACTION: urticaria (hives)   Family History  Problem Relation Age of Onset  . Hyperlipidemia Mother   . Hypertension Mother   . Migraines Mother   . Hyperlipidemia Father   . Hypertension Father   . Heart disease Father 70    MI/ CAD with AFIB  . Migraines Sister   . Migraines Son     Past medical history, social, surgical and family history all reviewed in electronic medical record.  No pertanent information unless stated regarding to the chief complaint.   Review of Systems: No headache, visual changes, nausea, vomiting, diarrhea, constipation, dizziness, abdominal pain, skin rash, fevers, chills, night sweats, weight loss, swollen lymph nodes, body aches, joint swelling, muscle aches, chest pain, shortness of breath, mood changes.   Objective  Blood pressure 126/82, pulse 81, height 5' 6.5" (1.689 m), weight 186 lb (84.4 kg), SpO2 97 %.  General: No apparent distress alert and  oriented x3 mood and affect normal, dressed appropriately.  HEENT: Pupils equal, extraocular movements intact  Respiratory: Patient's speak in full sentences and does not appear short of breath  Cardiovascular: No lower extremity edema, non tender, no erythema  Skin: Warm dry intact with no signs of infection or rash on extremities or on axial skeleton.  Abdomen: Soft nontender  Neuro: Cranial nerves II through XII are intact, neurovascularly intact in all extremities with 2+ DTRs and 2+ pulses.  Lymph: No lymphadenopathy of posterior or anterior cervical chain or axillae bilaterally.  Gait normal with good balance and coordination.  MSK:  Non tender with full range of motion and good stability and symmetric  strength and tone of shoulders, elbows, wrist, hip, and ankles bilaterally.  Knee: Right Normal to inspection with no erythema or effusion or obvious bony abnormalities. Moderate tenderness over the medial and lateral joint line ROM full in flexion and extension and lower leg rotation. Ligaments with solid consistent endpoints including ACL, PCL, LCL, MCL. Mild positive Mcmurray's, Apley's, and Thessalonian tests. Non painful patellar compression. Patellar glide without crepitus. Patellar and quadriceps tendons unremarkable. Hamstring and quadriceps strength is normal.  Contralateral knee unremarkable  MSK US performed of:  This study was ordered, performed, and interpreted by Charlann Boxer D.O.  Knee: All structures visualized. Patient does have hypoechoic changes and increasing Doppler flow with mild displacement of the lateral meniscus. Appears to be more of an acute on chronic tear. Seems to go from 50% on the lateral aspect of the knee posteriorly. Patellar Tendon unremarkable on long and transverse views without effusion. No abnormality of prepatellar bursa. LCL and MCL unremarkable on long and transverse views. No abnormality of origin of medial or lateral head of the gastrocnemius.  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      Note: This dictation was prepared with Dragon dictation along with smaller phrase technology. Any transcriptional errors that result from this process are unintentional.        zhi Ice 20 minutes 2 times daily. Usually after activity and before bed.

## 2016-06-11 ENCOUNTER — Encounter: Payer: Self-pay | Admitting: Family Medicine

## 2016-06-11 ENCOUNTER — Ambulatory Visit (INDEPENDENT_AMBULATORY_CARE_PROVIDER_SITE_OTHER): Payer: PRIVATE HEALTH INSURANCE | Admitting: Family Medicine

## 2016-06-11 ENCOUNTER — Ambulatory Visit: Payer: Self-pay

## 2016-06-11 VITALS — BP 126/82 | HR 81 | Ht 66.5 in | Wt 186.0 lb

## 2016-06-11 DIAGNOSIS — S838X1A Sprain of other specified parts of right knee, initial encounter: Secondary | ICD-10-CM

## 2016-06-11 DIAGNOSIS — M79604 Pain in right leg: Secondary | ICD-10-CM | POA: Diagnosis not present

## 2016-06-11 DIAGNOSIS — S8981XA Other specified injuries of right lower leg, initial encounter: Secondary | ICD-10-CM

## 2016-06-11 HISTORY — DX: Sprain of other specified parts of right knee, initial encounter: S83.8X1A

## 2016-06-11 MED ORDER — DICLOFENAC SODIUM 2 % TD SOLN: TRANSDERMAL | 3 refills | Status: DC

## 2016-06-11 NOTE — Patient Instructions (Addendum)
Good to see you.  Ice 20 minutes 2 times daily. Usually after activity and before bed. Exercises 3 times a week.  pennsaid pinkie amount topically 2 times daily as needed.  Avoid twisting.  Good shoes with rigid bottom.  Jalene Mullet, [place] or New balance greater then 700 Avoid being barefoot Consider compression sleeve on the knee to help with any swelling and remind you  Vitamin D 2000 IU daily  Turmeric 500mg  twice daily  See me again in 3-4 weeks and we will make sure you are healing and will consider also looking at your back if needed.

## 2016-06-11 NOTE — Assessment & Plan Note (Signed)
Patient does have what appears to be an acute on chronic lateral meniscal tear. Patient states that the pain seems to be more on the posterior medial aspect of the knee. Questionable lumbar radiculopathy is within the differential. We discussed with patient at great length.  Also discussed the radicular symptoms could just be how she is compensating for the knee. We will see how patient response to conservative therapy including topical anti-inflammatories, icing regimen, home exercises and compression. Patient and will come back and see me again in 3-4 weeks. If continuing have pain we'll consider injection as well as formal physical therapy.

## 2016-06-20 ENCOUNTER — Ambulatory Visit: Payer: PRIVATE HEALTH INSURANCE | Admitting: Family Medicine

## 2016-06-25 ENCOUNTER — Encounter: Payer: Self-pay | Admitting: Family Medicine

## 2016-06-25 DIAGNOSIS — M5416 Radiculopathy, lumbar region: Secondary | ICD-10-CM

## 2016-06-25 MED ORDER — PREDNISONE 50 MG PO TABS: 50.0000 mg | ORAL_TABLET | Freq: Every day | ORAL | 0 refills | Status: DC

## 2016-06-25 MED ORDER — GABAPENTIN 100 MG PO CAPS: 200.0000 mg | ORAL_CAPSULE | Freq: Every day | ORAL | 3 refills | Status: DC

## 2016-06-26 ENCOUNTER — Ambulatory Visit (INDEPENDENT_AMBULATORY_CARE_PROVIDER_SITE_OTHER)
Admission: RE | Admit: 2016-06-26 | Discharge: 2016-06-26 | Disposition: A | Discharge status: Home / Self Care | Payer: PRIVATE HEALTH INSURANCE | Source: Ambulatory Visit | Attending: Family Medicine | Admitting: Family Medicine

## 2016-06-26 DIAGNOSIS — M5416 Radiculopathy, lumbar region: Secondary | ICD-10-CM | POA: Diagnosis not present

## 2016-07-08 NOTE — Progress Notes (Signed)
[facility] [place], [place] [address] Phone: [phone] Subjective:    I'm seeing this patient by the request  of:  Colin Benton R., DO   CC: Right leg pain.  [id number]  Belinda Maxwell is a 50 y.o. female coming in with complaint of right leg pain. Patient was found to have a lateral meniscal tear. An states that the knee seems to be doing better though. Patient states that unfortunately the posterior aspect of the leg seems to be hurting more. States that it seems to be more intermittent and chronic. Patient though continues to have a dull aching sensation overall though. Patient states that sometimes can have a sharp pain as well. Concern is more of her hamstring but does go distally. Sometimes feels like she has even a tightness around her ankle.      Past Medical History:  Diagnosis Date  . Headache 02/03/2008   Qualifier: Diagnosis of  By: Leanne Chang MD, Bruce  Menstrual related   . Headache(784.0)   . Hearing loss   . Hemorrhoid 01/11/2016   -sees surgeon in [place]   . Hot flashes 01/11/2016   -seeing gynecologist   . Hyperlipemia 01/11/2016  . Hypertension 04/19/2010   Qualifier: Diagnosis of  By: Leanne Chang MD, Bruce     . Pre-eclampsia    Past Surgical History:  Procedure Laterality Date  . CHOLECYSTECTOMY    . hemorrhoid banding     Social History   Social History  . Marital status: Married    Spouse name: N/A  . Number of children: N/A  . Years of education: N/A   Social History Main Topics  . Smoking status: Never Smoker  . Smokeless tobacco: Not on file  . Alcohol use No  . Drug use: No  . Sexual activity: Not on file   Other Topics Concern  . Not on file   Social History Narrative   Work or School: homemaker, looking for job      Home Situation: lives with husband and two children      Spiritual Beliefs: Christian      Lifestyle: tries to walk dog on a regular basis; diet is so so             Allergies  Allergen Reactions  . Sulfamethoxazole     REACTION: urticaria (hives)   Family History  Problem Relation Age of Onset  . Hyperlipidemia Mother   . Hypertension Mother   . Migraines Mother   . Hyperlipidemia Father   . Hypertension Father   . Heart disease Father 37    MI/ CAD with AFIB  . Migraines Sister   . Migraines Son     Past medical history, social, surgical and family history all reviewed in electronic medical record.  No pertanent information unless stated regarding to the chief complaint.   Review of Systems: No headache, visual changes, nausea, vomiting, diarrhea, constipation, dizziness, abdominal pain, skin rash, fevers, chills, night sweats, weight loss, swollen lymph nodes, body aches, joint swelling, muscle aches, chest pain, shortness of breath, mood changes.   Objective  There were no vitals taken for this visit.  General: No apparent distress alert and oriented x3 mood and affect normal, dressed appropriately.  HEENT: Pupils equal, extraocular movements intact  Respiratory: Patient's speak in full sentences and does not appear short of breath  Cardiovascular: No lower extremity edema, non tender, no erythema  Skin: Warm dry intact with no signs of  infection or rash on extremities or on axial skeleton.  Abdomen: Soft nontender  Neuro: Cranial nerves II through XII are intact, neurovascularly intact in all extremities with 2+ DTRs and 2+ pulses.  Lymph: No lymphadenopathy of posterior or anterior cervical chain or axillae bilaterally.  Gait normal with good balance and coordination.  MSK:  Non tender with full range of motion and good stability and symmetric strength and tone of shoulders, elbows, wrist, hip, and ankles bilaterally.  Knee: Right Normal to inspection with no erythema or effusion or obvious bony abnormalities. Minimal tenderness which is an improvement ROM full in flexion and extension and lower leg rotation. Ligaments with solid  consistent endpoints including ACL, PCL, LCL, MCL. Negative Mcmurray's, Apley's, and Thessalonian tests. Non painful patellar compression. Patellar glide without crepitus. Patellar and quadriceps tendons unremarkable. Hamstring and quadriceps strength is normal.  Contralateral knee unremarkable Improvement from previous exam   Back exam shows the patient does have a positive Corky Sox. Negative straight leg test. Mild tightness of the hamstring on the right side compared to the contralateral side. Patient has full strength. Neurovascular intact distally with 2+ distal pulses   Procedure note 97110; 15 minutes spent for Therapeutic exercises as stated in above notes.  This included exercises focusing on stretching, strengthening, with significant focus on eccentric aspects.  Piriformis Syndrome  Using an anatomical model, reviewed with the patient the structures involved and how they related to diagnosis. The patient indicated understanding.   The patient was given a handout from Dr. Arne Cleveland book "The Sports Medicine Patient Advisor" describing the anatomy and rehabilitation of the following condition: Piriformis Syndrome  Also given a handout with more extensive Piriformis stretching, hip flexor and abductor strengthening, ham stretching  Rec deep massage, explained self-massage with ball  Proper technique shown and discussed handout in great detail with ATC.  All questions were discussed and answered.     Impression and Recommendations:     This case required medical decision making of moderate complexity.      Note: This dictation was prepared with Dragon dictation along with smaller phrase technology. Any transcriptional errors that result from this process are unintentional.        zhi Ice 20 minutes 2 times daily. Usually after activity and before bed.

## 2016-07-09 ENCOUNTER — Ambulatory Visit (INDEPENDENT_AMBULATORY_CARE_PROVIDER_SITE_OTHER): Payer: PRIVATE HEALTH INSURANCE | Admitting: Family Medicine

## 2016-07-09 ENCOUNTER — Encounter: Payer: Self-pay | Admitting: Family Medicine

## 2016-07-09 VITALS — BP 126/72 | HR 80 | Wt 187.0 lb

## 2016-07-09 DIAGNOSIS — G5701 Lesion of sciatic nerve, right lower limb: Secondary | ICD-10-CM | POA: Diagnosis not present

## 2016-07-09 NOTE — Patient Instructions (Signed)
Good to see you  We will try stretching the piriformis.  Gabapentin 300mg  for 3 nights and if not better then stop and see how you do Up to you on the turmeric.  Tennis ball in back right pocket with a lot of sitting.  Ice 20 minutes 2 times daily. Usually after activity and before bed. See me again in 4 weeks and if not better we will consider injection and bring someone with you.

## 2016-07-09 NOTE — Assessment & Plan Note (Signed)
After thorough evaluation in discussing with patient at great length and do feel that patient's pain now in the posterior aspect of the knee and going distally seems to be more severe than a true radiculopathy. No back pain but I do feel that a piriformis could be contributing. Patient was given exercises and will be referred to formal physical therapy. Patient is optimistic that dry needling could be beneficial. Discussed with patient and will try this conservative therapy and patient will try to transition on some of the medication. If worsening symptoms, patient come back and we'll consider a diagnostic injection of the piriformis.

## 2016-07-16 LAB — HM MAMMOGRAPHY

## 2016-08-05 NOTE — Progress Notes (Signed)
[facility] [place], [place] [address] Phone: [phone] Subjective:    I'm seeing this patient by the request  of:  Colin Benton R., DO   CC: Right leg pain f/u  [id number]  Belinda Maxwell is a 50 y.o. female coming in with complaint of right leg pain. Found to have more of a piriformis syndrome. Patient was given home exercises, icing protocol, which activities doing which ones to avoid. Patient was also sent to formal physical therapy. Patient states Unfortunate she has not notice any significant improvement. Having increasing radicular symptoms on the posterior aspect the leg. Less intermittent and what it was before and now more constant. Denies any weakness but states that she feels like she has sometimes to be very careful with walking. States that he can wake her up at night. Some of the medications have been minorly beneficial she states.     Past Medical History:  Diagnosis Date  . Headache 02/03/2008   Qualifier: Diagnosis of  By: Leanne Chang MD, Bruce  Menstrual related   . Headache(784.0)   . Hearing loss   . Hemorrhoid 01/11/2016   -sees surgeon in [place]   . Hot flashes 01/11/2016   -seeing gynecologist   . Hyperlipemia 01/11/2016  . Hypertension 04/19/2010   Qualifier: Diagnosis of  By: Leanne Chang MD, Bruce     . Pre-eclampsia    Past Surgical History:  Procedure Laterality Date  . CHOLECYSTECTOMY    . hemorrhoid banding     Social History   Social History  . Marital status: Married    Spouse name: N/A  . Number of children: N/A  . Years of education: N/A   Social History Main Topics  . Smoking status: Never Smoker  . Smokeless tobacco: Not on file  . Alcohol use No  . Drug use: No  . Sexual activity: Not on file   Other Topics Concern  . Not on file   Social History Narrative   Work or School: homemaker, looking for job      Home Situation: lives with husband and two children      Spiritual Beliefs:  Christian      Lifestyle: tries to walk dog on a regular basis; diet is so so            Allergies  Allergen Reactions  . Sulfamethoxazole     REACTION: urticaria (hives)   Family History  Problem Relation Age of Onset  . Hyperlipidemia Mother   . Hypertension Mother   . Migraines Mother   . Hyperlipidemia Father   . Hypertension Father   . Heart disease Father 77    MI/ CAD with AFIB  . Migraines Sister   . Migraines Son     Past medical history, social, surgical and family history all reviewed in electronic medical record.  No pertanent information unless stated regarding to the chief complaint.   Review of Systems: No headache, visual changes, nausea, vomiting, diarrhea, constipation, dizziness, abdominal pain, skin rash, fevers, chills, night sweats, weight loss, swollen lymph nodes,  chest pain, shortness of breath, mood changes.   Objective  There were no vitals taken for this visit.  General: No apparent distress alert and oriented x3 mood and affect normal, dressed appropriately.  HEENT: Pupils equal, extraocular movements intact  Respiratory: Patient's speak in full sentences and does not appear short of breath  Cardiovascular: No lower extremity edema, non tender, no erythema  Skin: Warm dry  intact with no signs of infection or rash on extremities or on axial skeleton.  Abdomen: Soft nontender  Neuro: Cranial nerves II through XII are intact, neurovascularly intact in all extremities with 2+ DTRs and 2+ pulses.  Lymph: No lymphadenopathy of posterior or anterior cervical chain or axillae bilaterally.  Gait normal with good balance and coordination.  MSK:  Non tender with full range of motion and good stability and symmetric strength and tone of shoulders, elbows, wrist, hip, and ankles bilaterally.    Back exam shows the patient does have a positive Corky Sox. Positive straight leg test and this is a new finding from previous exam.. tightness of the hamstring on the  right side compared to the contralateral side and worsening. 4 Out of 5 strength compared to the contralateral side which is also a new finding. Neurovascular intact distally with 2+ distal pulses      Impression and Recommendations:     This case required medical decision making of moderate complexity.      Note: This dictation was prepared with Dragon dictation along with smaller phrase technology. Any transcriptional errors that result from this process are unintentional.        zhi Ice 20 minutes 2 times daily. Usually after activity and before bed.

## 2016-08-06 ENCOUNTER — Encounter: Payer: Self-pay | Admitting: Family Medicine

## 2016-08-06 ENCOUNTER — Ambulatory Visit (INDEPENDENT_AMBULATORY_CARE_PROVIDER_SITE_OTHER): Payer: PRIVATE HEALTH INSURANCE | Admitting: Family Medicine

## 2016-08-06 VITALS — BP 102/74 | HR 83 | Wt 184.0 lb

## 2016-08-06 DIAGNOSIS — M5416 Radiculopathy, lumbar region: Secondary | ICD-10-CM

## 2016-08-06 HISTORY — DX: Radiculopathy, lumbar region: M54.16

## 2016-08-06 NOTE — Patient Instructions (Signed)
Good to see you  Ice is your friend  Gabapentin 300mg  at night MRI of low back ordered today .  Depending on finding we will either order an epidural or we will consider piriformis injection.  Hopefully we will have answers soon.

## 2016-08-06 NOTE — Assessment & Plan Note (Signed)
Patient does have more of a lumbar radiculopathy. Patient seems to be having more of a positive straight leg test. Previous x-rays did show an L4-L5 and L5-S1 degenerative disc disease which would be consistent with her findings. Patient states that back in 2008 she responded well to an epidural steroid injection and she could be a candidate again. Encourage her to continue the gabapentin possibly increasing 300 mg at night. Didn't imaging is warranted with patient now having weakness compared to contralateral side as well. Follow-up again depending on imaging. Spent  25 minutes with patient face-to-face and had greater than 50% of counseling including as described above in assessment and plan.

## 2016-08-08 ENCOUNTER — Encounter: Payer: Self-pay | Admitting: Family Medicine

## 2016-08-17 ENCOUNTER — Ambulatory Visit
Admission: RE | Admit: 2016-08-17 | Discharge: 2016-08-17 | Disposition: A | Discharge status: Home / Self Care | Payer: PRIVATE HEALTH INSURANCE | Source: Ambulatory Visit | Attending: Family Medicine | Admitting: Family Medicine

## 2016-08-17 DIAGNOSIS — M5416 Radiculopathy, lumbar region: Secondary | ICD-10-CM

## 2016-09-29 ENCOUNTER — Encounter: Payer: Self-pay | Admitting: Family Medicine

## 2016-09-29 DIAGNOSIS — M5416 Radiculopathy, lumbar region: Secondary | ICD-10-CM

## 2016-10-18 ENCOUNTER — Ambulatory Visit
Admission: RE | Admit: 2016-10-18 | Discharge: 2016-10-18 | Disposition: A | Discharge status: Home / Self Care | Payer: PRIVATE HEALTH INSURANCE | Source: Ambulatory Visit | Attending: Family Medicine | Admitting: Family Medicine

## 2016-10-18 DIAGNOSIS — M5416 Radiculopathy, lumbar region: Secondary | ICD-10-CM

## 2016-10-18 MED ORDER — METHYLPREDNISOLONE ACETATE 40 MG/ML INJ SUSP (RADIOLOG
120.0000 mg | Freq: Once | INTRAMUSCULAR | Status: AC
  Administered 2016-10-18: 120 mg via EPIDURAL

## 2016-10-18 MED ORDER — IOPAMIDOL (ISOVUE-M 200) INJECTION 41%
1.0000 mL | Freq: Once | INTRAMUSCULAR | Status: AC
  Administered 2016-10-18: 1 mL via EPIDURAL

## 2016-10-18 NOTE — Discharge Instructions (Signed)

## 2016-11-07 ENCOUNTER — Encounter: Payer: Self-pay | Admitting: Family Medicine

## 2016-11-07 ENCOUNTER — Ambulatory Visit (INDEPENDENT_AMBULATORY_CARE_PROVIDER_SITE_OTHER): Payer: PRIVATE HEALTH INSURANCE | Admitting: Family Medicine

## 2016-11-07 VITALS — BP 118/72 | HR 78 | Ht 67.0 in | Wt 188.0 lb

## 2016-11-07 DIAGNOSIS — M5416 Radiculopathy, lumbar region: Secondary | ICD-10-CM

## 2016-11-07 DIAGNOSIS — M180 Bilateral primary osteoarthritis of first carpometacarpal joints: Secondary | ICD-10-CM | POA: Diagnosis not present

## 2016-11-07 NOTE — Progress Notes (Signed)
[facility] [address] [place], [place] [address] Phone: [phone] Subjective:    I'm seeing this patient by the request  of:  Colin Benton R., DO   CC: Right leg pain f/u  [id number]  Bel[facility] is a 51 y.o. female coming in with complaint of right leg pain. Found to have more of a piriformis syndrome. Patient was given home exercises, icing protocol, which activities doing which ones to avoid. Patient was also sent to formal physical therapy.  Patient was not making any significant improvement at last exam. Was concern for more of a lumbar radiculopathy. Patient was taking gabapentin at night as well as meloxicam for breakthrough pain. Patient was having worsening symptoms in with mild spinal stenosis at L4-L5 noted on MRI previously patient was sent for an epidural  L4-5 January 5th  2018.  Patient states within the first week she was doing 80% better. Second week she was close to 90% better where she was only having very minimal pain. Patient states unfortunately over the course of this week started having increasing pain again. Almost as bad as what it was previously. Still having radicular symptoms going down the leg that is now more constant. Denies any weakness though. Patient states though that it is starting to affect daily activities. Patient has stopped working out or doing the exercises secondary to discomfort.  Patient is also complaining of right thumb pain. 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 Patient states that this feels very similar. Starting have pain with repetitive activity. Has been wearing the brace very intermittently. Patient wants to make sure that is what from. Denies any radiation of pain. Mild weakness me. Denies any neck pain associated with it. Rates the severity pain is 4 out of 10.   Previous imaging includes an MRI of patient's lumbar spine taking 08/17/2016. This was independently visualized by me.  Patient did have degenerative disc disease at L4-L5 with mild spinal stenosis. Otherwise fairly unremarkable. Past Medical History:  Diagnosis Date  . Headache 02/03/2008   Qualifier: Diagnosis of  By: Leanne Chang MD, Bruce  Menstrual related   . Headache(784.0)   . Hearing loss   . Hemorrhoid 01/11/2016   -sees surgeon in [place]   . Hot flashes 01/11/2016   -seeing gynecologist   . Hyperlipemia 01/11/2016  . Hypertension 04/19/2010   Qualifier: Diagnosis of  By: Leanne Chang MD, Bruce     . Pre-eclampsia    Past Surgical History:  Procedure Laterality Date  . CHOLECYSTECTOMY    . hemorrhoid banding     Social History   Social History  . Marital status: Married    Spouse name: N/A  . Number of children: N/A  . Years of education: N/A   Social History Main Topics  . Smoking status: Never Smoker  . Smokeless tobacco: Never Used  . Alcohol use No  . Drug use: No  . Sexual activity: Not Asked   Other Topics Concern  . None   Social History Narrative   Work or School: homemaker, looking for job      Home Situation: lives with husband and two children      Spiritual Beliefs: Christian      Lifestyle: tries to walk dog on a regular basis; diet is so so            Allergies  Allergen Reactions  . Sulfamethoxazole     REACTION: urticaria (hives)   Family  History  Problem Relation Age of Onset  . Hyperlipidemia Mother   . Hypertension Mother   . Migraines Mother   . Hyperlipidemia Father   . Hypertension Father   . Heart disease Father 81    MI/ CAD with AFIB  . Migraines Sister   . Migraines Son     Past medical history, social, surgical and family history all reviewed in electronic medical record.  No pertanent information unless stated regarding to the chief complaint.   Review of Systems: No headache, visual changes, nausea, vomiting, diarrhea, constipation, dizziness, abdominal pain, skin rash, fevers, chills, night sweats, weight loss, swollen lymph nodes, body  aches, joint swelling, muscle aches, chest pain, shortness of breath, mood changes.      Objective  Blood pressure 118/72, pulse 78, height 5\' 7"  (1.702 m), weight 188 lb (85.3 kg).  Systems examined below as of 11/07/16 General: NAD A&O x3 mood, affect normal  HEENT: Pupils equal, extraocular movements intact no nystagmus Respiratory: not short of breath at rest or with speaking Cardiovascular: No lower extremity edema, non tender Skin: Warm dry intact with no signs of infection or rash on extremities or on axial skeleton. Abdomen: Soft nontender, no masses Neuro: Cranial nerves  intact, neurovascularly intact in all extremities with 2+ DTRs and 2+ pulses. Lymph: No lymphadenopathy appreciated today  Gait normal with good balance and coordination.  MSK: Non tender with full range of motion and good stability and symmetric strength and tone of shoulders, elbows,   knee hips and ankles bilaterally.     Back Exam:  Inspection: Unremarkable  Motion: Flexion 40 deg, Extension 25 deg mild worsening symptoms, Side Bending to 45 deg bilaterally,  Rotation to 45 deg bilaterally  SLR laying: positive right  XSLR laying: Negative  Palpable tenderness: still TTP in lower right paraspinal musculature. Marland Kitchen FABER: negative. Sensory change: Gross sensation intact to all lumbar and sacral dermatomes.  Reflexes: 2+ at both patellar tendons, 2+ at achilles tendons, Babinski's downgoing.  Strength at foot  Plantar-flexion: 5/5 Dorsi-flexion: 5/5 Eversion: 5/5 Inversion: 5/5  Leg strength  Quad: 5/5 Hamstring: 5/5 Hip flexor: 5/5 Hip abductors: 4/5 symmetric.  Gait unremarkable.  Wrist: Right Inspection normal with no visible erythema or swelling. ROM smooth and normal with good flexion and extension and ulnar/radial deviation that is symmetrical with opposite wrist. Very minimal atrophy of the thenar eminence Palpation is normal over metacarpals, navicular, lunate, and TFCC; tendons without  tenderness/ swelling No snuffbox tenderness. No tenderness over Canal of [place]. Strength 4/5 in all directions without pain. Negative Finkelstein, tinel's and phalens. Positive grind test Negative Watson's test.     Impression and Recommendations:     This case required medical decision making of moderate complexity.      Note: This dictation was prepared with Dragon dictation along with smaller phrase technology. Any transcriptional errors that result from this process are unintentional.        zhi Ice 20 minutes 2 times daily. Usually after activity and before bed.

## 2016-11-07 NOTE — Assessment & Plan Note (Signed)
worsening symptoms again with radiation. No weakness, affecting daily activities and now stopping her from activity like working out. Having trouble with daily activities as well.  Will repeat epidural again and hope for even more improvement. RTC 2 weeks after epidural.

## 2016-11-07 NOTE — Patient Instructions (Signed)
Good to see you  Ice 20 minutes 2 times daily. Usually after activity and before bed. Start increasing activity  Tylenol 500mg  3 times daily  Turmeric 500mg  twice daily Tart cherry extract any dose at night Wear the wrist brace at night Warm hand bath in AM Try to also wear a glove at night Send me a message 2 weeks after epidural and tell me how you are doing.

## 2016-11-07 NOTE — Assessment & Plan Note (Signed)
Tylenol OTC meds, discussed icing. Bracing at night If worsening then consider injection.

## 2016-11-15 ENCOUNTER — Ambulatory Visit
Admission: RE | Admit: 2016-11-15 | Discharge: 2016-11-15 | Disposition: A | Discharge status: Home / Self Care | Payer: PRIVATE HEALTH INSURANCE | Source: Ambulatory Visit | Attending: Family Medicine | Admitting: Family Medicine

## 2016-11-15 DIAGNOSIS — M5416 Radiculopathy, lumbar region: Secondary | ICD-10-CM

## 2016-11-15 MED ORDER — METHYLPREDNISOLONE ACETATE 40 MG/ML INJ SUSP (RADIOLOG
120.0000 mg | Freq: Once | INTRAMUSCULAR | Status: AC
  Administered 2016-11-15: 120 mg via EPIDURAL

## 2016-11-15 MED ORDER — IOPAMIDOL (ISOVUE-M 200) INJECTION 41%
1.0000 mL | Freq: Once | INTRAMUSCULAR | Status: AC
  Administered 2016-11-15: 1 mL via EPIDURAL

## 2016-11-15 NOTE — Discharge Instructions (Signed)

## 2016-12-22 ENCOUNTER — Encounter: Payer: Self-pay | Admitting: Family Medicine

## 2016-12-30 NOTE — Progress Notes (Signed)
[facility] [address] [place], [place] [address] Phone: [phone] Subjective:    I'm seeing this patient by the request  of:  Colin Benton R., DO   CC: Right leg pain f/u  [id number]  Belinda [facility] is a 51 y.o. female coming in with complaint of right leg pain. Found to have more of a piriformis syndrome. Patient was given home exercises, icing protocol, which activities doing which ones to avoid. Patient was also sent to formal physical therapy.  Patient was not making any significant improvement at last exam. Was concern for more of a lumbar radiculopathy. Patient was taking gabapentin at night as well as meloxicam for breakthrough pain. Patient was having worsening symptoms in with mild spinal stenosis at L4-L5 noted on MRI previously patient was sent for an epidural  L4-5 January 5th  2018.  Patient made a good amount of improvement with the first epidural and then undergone a second epidural. Patient was doing fairly well but unfortunately 5 weeks out patient started having increasing discomfort again. Patient started more with physical therapy and dry needling. States that she continues to have pain though. Seems to be more localized into the right buttocks now. Patient states that there is still radiation going down the leg. Patient states that it is better than the initial visit but still not completely resolved. Can still affect daily activities and wake her up at night. Taking the gabapentin fairly regularly at night. Trying to avoid oral anti-inflammatories as much as possible.   a   Previous imaging includes an MRI of patient's lumbar spine taking 08/17/2016. This was independently visualized by me. Patient did have degenerative disc disease at L4-L5 with mild spinal stenosis. Otherwise fairly unremarkable. Past Medical History:  Diagnosis Date  . Headache 02/03/2008   Qualifier: Diagnosis of  By: Leanne Chang MD, Bruce  Menstrual related   .  Headache(784.0)   . Hearing loss   . Hemorrhoid 01/11/2016   -sees surgeon in [place]   . Hot flashes 01/11/2016   -seeing gynecologist   . Hyperlipemia 01/11/2016  . Hypertension 04/19/2010   Qualifier: Diagnosis of  By: Leanne Chang MD, Bruce     . Pre-eclampsia    Past Surgical History:  Procedure Laterality Date  . CHOLECYSTECTOMY    . hemorrhoid banding     Social History   Social History  . Marital status: Married    Spouse name: N/A  . Number of children: N/A  . Years of education: N/A   Social History Main Topics  . Smoking status: Never Smoker  . Smokeless tobacco: Never Used  . Alcohol use No  . Drug use: No  . Sexual activity: Not Asked   Other Topics Concern  . None   Social History Narrative   Work or School: homemaker, looking for job      Home Situation: lives with husband and two children      Spiritual Beliefs: Christian      Lifestyle: tries to walk dog on a regular basis; diet is so so            Allergies  Allergen Reactions  . Sulfamethoxazole Hives   Family History  Problem Relation Age of Onset  . Hyperlipidemia Mother   . Hypertension Mother   . Migraines Mother   . Hyperlipidemia Father   . Hypertension Father   . Heart disease Father 69    MI/ CAD with AFIB  . Migraines Sister   .  Migraines Son     Past medical history, social, surgical and family history all reviewed in electronic medical record.  No pertanent information unless stated regarding to the chief complaint.   Review of Systems: No headache, visual changes, nausea, vomiting, diarrhea, constipation, dizziness, abdominal pain, skin rash, fevers, chills, night sweats, weight loss, swollen lymph nodes, body aches, joint swelling, muscle aches, chest pain, shortness of breath, mood changes.      Objective  Blood pressure 120/84, pulse 88, height 5\' 7"  (1.702 m), weight 189 lb 12.8 oz (86.1 kg), SpO2 95 %.  Systems examined below as of 12/31/16 General: NAD A&O x3 mood,  affect normal  HEENT: Pupils equal, extraocular movements intact no nystagmus Respiratory: not short of breath at rest or with speaking Cardiovascular: No lower extremity edema, non tender Skin: Warm dry intact with no signs of infection or rash on extremities or on axial skeleton. Abdomen: Soft nontender, no masses Neuro: Cranial nerves  intact, neurovascularly intact in all extremities with 2+ DTRs and 2+ pulses. Lymph: No lymphadenopathy appreciated today  Gait normal with good balance and coordination.  MSK: Non tender with full range of motion and good stability and symmetric strength and tone of shoulders, elbows, wrist,  knee hips and ankles bilaterally.     Back Exam:  Inspection: Unremarkable  Motion: Flexion 40 deg, Extension 25 deg , Side Bending to 45 deg bilaterally,  Rotation to 45 deg bilaterally  SLR laying: positive right  XSLR laying: Negative  Palpable tenderness: still TTP in lower right paraspinal musculature. . Worsening tenderness over the right piriformis. FABER: Severely positive right which is worse than previous exam. Sensory change: Gross sensation intact to all lumbar and sacral dermatomes.  Reflexes: 2+ at both patellar tendons, 2+ at achilles tendons, Babinski's downgoing.  Strength at foot  Plantar-flexion: 5/5 Dorsi-flexion: 5/5 Eversion: 5/5 Inversion: 5/5  Leg strength  Quad: 5/5 Hamstring: 5/5 Hip flexor: 5/5 Hip abductors: 4/5 symmetric.  Gait unremarkable.      Impression and Recommendations:     This case required medical decision making of moderate complexity.      Note: This dictation was prepared with Dragon dictation along with smaller phrase technology. Any transcriptional errors that result from this process are unintentional.        zhi Ice 20 minutes 2 times daily. Usually after activity and before bed.

## 2016-12-31 ENCOUNTER — Ambulatory Visit (INDEPENDENT_AMBULATORY_CARE_PROVIDER_SITE_OTHER): Payer: PRIVATE HEALTH INSURANCE | Admitting: Family Medicine

## 2016-12-31 ENCOUNTER — Encounter: Payer: Self-pay | Admitting: Family Medicine

## 2016-12-31 ENCOUNTER — Ambulatory Visit: Payer: Self-pay

## 2016-12-31 VITALS — BP 120/84 | HR 88 | Ht 67.0 in | Wt 189.8 lb

## 2016-12-31 DIAGNOSIS — M7918 Myalgia, other site: Secondary | ICD-10-CM

## 2016-12-31 DIAGNOSIS — M791 Myalgia: Secondary | ICD-10-CM | POA: Diagnosis not present

## 2016-12-31 DIAGNOSIS — G5701 Lesion of sciatic nerve, right lower limb: Secondary | ICD-10-CM | POA: Diagnosis not present

## 2016-12-31 NOTE — Patient Instructions (Signed)
Good to see you  Tried the piriformis injection today  If not better in 2-3 days send message and we will get EMG.  If gets better then great! Keep doing the exercises starting again in 2 days.  May need ice or ibuprofen in 6 hours See em again in 2-3 weeks and we can discuss or write me sooner if you would like me to do PRP injection at follow up.

## 2016-12-31 NOTE — Assessment & Plan Note (Signed)
Patient was having worsening symptoms in more the piriformis. We attempted an injection today. Warned of potential side effects. We discussed icing regimen and home exercises, we discussed which activities doing which ones to avoid. This will hopefully be diagnostic as well as. If continuing have trouble we'll consider an EMG. Patient come back in 2-3 weeks. If this does help him pain comes back may be considered more of a PRP injection.

## 2017-01-02 ENCOUNTER — Encounter: Payer: Self-pay | Admitting: Family Medicine

## 2017-01-06 ENCOUNTER — Other Ambulatory Visit: Payer: Self-pay

## 2017-01-06 DIAGNOSIS — M5416 Radiculopathy, lumbar region: Secondary | ICD-10-CM

## 2017-01-24 ENCOUNTER — Other Ambulatory Visit: Payer: PRIVATE HEALTH INSURANCE

## 2017-05-22 ENCOUNTER — Other Ambulatory Visit: Payer: Self-pay | Admitting: Family Medicine

## 2017-07-03 ENCOUNTER — Encounter: Payer: Self-pay | Admitting: Family Medicine

## 2017-10-03 ENCOUNTER — Other Ambulatory Visit: Payer: Self-pay | Admitting: Family Medicine

## 2017-10-23 ENCOUNTER — Encounter: Payer: Self-pay | Admitting: Family Medicine

## 2017-11-08 NOTE — Progress Notes (Signed)
HPI:  Here for CPE: Due for labs, flu vaccine, ?mamm/pap (see gyn), colon ca screening Refuses vaccines. Reports up-to-date on her Pap smears and mammograms, reports she sees gynecologist P[place], [place]. Refuses colon cancer screening for now, agrees to call in a few months if she changes her mind about the Cologuard test.  Does not want the other options.  -Concerns and/or follow up today:   Belinda Maxwell is a pleasant 52 y.o. here for follow up. Chronic medical problems summarized below were reviewed for changes and stability and were updated as needed below. These issues and their treatment remain stable for the most part. Has had a cold the last few weeks, now better. Wants me to check her ears as they have been a little full. No pain. Denies CP, SOB, DOE, treatment intolerance or new symptoms.  HTN: -meds: lisinopril 45m  -stable  Hyperlipidemia/obesity: -lifestyle recs advised -Just started back exercise, diet so-so  Hot flashes: -seeing gyn and did not tolerate HRT or clonidine -tolerable on black cohosh   -Diet: variety of foods, balance and well rounded, larger portion sizes -Exercise: no regular exercise -Taking folic acid, vitamin D or calcium: no -Diabetes and Dyslipidemia Screening: Fasting for labs -Vaccines: see vaccine section EPIC -pap history: Sees gynecologist, reports up-to-date, declines here -FDLMP: see nursing notes -sexual activity: yes, female partner, no new partners -wants STI testing (Hep C if born [birth year]: no -FH breast, colon or ovarian ca: see FH Last mammogram: Reports sees gynecologist for this Last colon cancer screening: Discussed options, she declined today, she is considering the Cologuard but wants to check with her insurance again on this Breast Ca Risk Assessment: see family history and pt history DEXA (>/= 65): Not applicable  -Alcohol, Tobacco, drug use: see social history  Review of Systems - no fevers, unintentional  weight loss, vision loss, hearing loss, chest pain, sob, hemoptysis, melena, hematochezia, hematuria, genital discharge, changing or concerning skin lesions, bleeding, bruising, loc, thoughts of self harm or SI  Past Medical History:  Diagnosis Date  . Headache 02/03/2008   Qualifier: Diagnosis of  By: SLeanne ChangMD, Bruce  Menstrual related   . Headache(784.0)   . Hearing loss   . Hemorrhoid 01/11/2016   [place]  . Hot flashes 01/11/2016   -seeing gynecologist   . Hyperlipemia 01/11/2016  . Hypertension 04/19/2010   Qualifier: Diagnosis of  By: SLeanne ChangMD, Bruce     . Pre-eclampsia     Past Surgical History:  Procedure Laterality Date  . CHOLECYSTECTOMY    . hemorrhoid banding      Family History  Problem Relation Age of Onset  . Hyperlipidemia Mother   . Hypertension Mother   . Migraines Mother   . Hyperlipidemia Father   . Hypertension Father   . Heart disease Father 760      MI/ CAD with AFIB  . Migraines Sister   . Migraines Son     Social History   Socioeconomic History  . Marital status: Married    Spouse name: None  . Number of children: None  . Years of education: None  . Highest education level: None  Social Needs  . Financial resource strain: None  . Food insecurity - worry: None  . Food insecurity - inability: None  . Transportation needs - medical: None  . Transportation needs - non-medical: None  Occupational History  . None  Tobacco Use  . Smoking status: Never Smoker  .  Smokeless tobacco: Never Used  Substance and Sexual Activity  . Alcohol use: No  . Drug use: No  . Sexual activity: None  Other Topics Concern  . None  Social History Narrative   Work or School: homemaker, looking for job      Home Situation: lives with husband and two children      Spiritual Beliefs: Christian      Lifestyle: tries to walk dog on a regular basis; diet is so so              Current Outpatient Medications:  .  hydrocortisone  (PROCTOSOL HC) 2.5 % rectal cream, APPLY RECTALLY TWICE A DAY, Disp: 28.35 g, Rfl: 1 .  ibuprofen (ADVIL,MOTRIN) 200 MG tablet, Take 200 mg by mouth every 6 (six) hours as needed.  , Disp: , Rfl:  .  lisinopril (PRINIVIL,ZESTRIL) 10 MG tablet, TAKE 1 TABLET (10 MG TOTAL) BY MOUTH DAILY., Disp: 90 tablet, Rfl: 1 .  meloxicam (MOBIC) 15 MG tablet, TAKE 1 TABLET (15 MG TOTAL) BY MOUTH DAILY AS NEEDED FOR PAIN., Disp: 30 tablet, Rfl: 0 .  NON FORMULARY, Hormonal supplement with Soy and Black Cohosh, Disp: , Rfl:  .  SUMAtriptan (IMITREX) 100 MG tablet, TAKE 1 TABLET (100 MG TOTAL) BY MOUTH EVERY 2 (TWO) HOURS AS NEEDED., Disp: 9 tablet, Rfl: 0 .  triamcinolone cream (KENALOG) 0.1 %, Apply 1 application topically 2 (two) times daily., Disp: 30 g, Rfl: 0  EXAM:  Vitals:   11/11/17 0750  BP: 108/80  Pulse: 73  Temp: 98.1 F (36.7 C)    GENERAL: vitals reviewed and listed below, alert, oriented, appears well hydrated and in no acute distress  HEENT: head atraumatic, PERRLA, normal appearance of eyes, ears, nose and mouth. moist mucus membranes.normal appearance of ear canals and TMs, clear nasal congestion, mild post oropharyngeal erythema with PND, no tonsillar edema or exudate, no sinus TTP  NECK: supple, no masses or lymphadenopathy  LUNGS: clear to auscultation bilaterally, no rales, rhonchi or wheeze  CV: HRRR, no peripheral edema or cyanosis, normal pedal pulses  ABDOMEN: bowel sounds normal, soft, non tender to palpation, no masses, no rebound or guarding  GU/BREAST: Declined, does with GYN  SKIN: no rash or abnormal lesions, scattered SKs, one small slightly irregular in appearance mole on the right upper back -approximately 3-4 mm in diameter  MS: normal gait, moves all extremities normally  NEURO: normal gait, speech and thought processing grossly intact, muscle tone grossly intact throughout  PSYCH: normal affect, pleasant and cooperative  ASSESSMENT AND PLAN:  Discussed  the following assessment and plan:  PREVENTIVE EXAM: -Discussed and advised all [place] preventive services health task force level A and B recommendations for age, sex and risks. -Advised at least 150 minutes of exercise per week and a healthy diet with avoidance of (less then 1 serving per week) processed foods, white starches, red meat, fast foods and sweets and consisting of: * 5-9 servings of fresh fruits and vegetables (not corn or potatoes) *nuts and seeds, beans *olives and olive oil *lean meats such as fish and white chicken  *whole grains -labs, studies and vaccines per orders this encounter -she refused vaccines and colon cancer screening today, agrees to call us if decides to do cologuard -she reports she does gyn and mammo screenings with her gynecologist  2. Screening for depression -neg  3. Essential hypertension -cont medication, lifestyle recs - Basic metabolic panel - CBC  4. Hyperlipidemia, unspecified hyperlipidemia type -advised  wt reduction, healthy lifestyle - Lipid panel  5. Nevus -R upper back, advised derm eval or biopsy - she will consider, discussed possible etiologies   Patient advised to return to clinic immediately if symptoms worsen or persist or new concerns.  Patient Instructions  BEFORE YOU LEAVE: -Wendie Simmer, please contact her gynecologist for her last Pap smear and mammogram results to update health maintenance -Labs  -follow up: Follow-up 4-6 months  Please decide what she what to do about the colon cancer screening and let us know.  Please contact her gynecologist to set up a GYN exam.  We have ordered labs or studies at this visit. It can take up to 1-2 weeks for results and processing. IF results require follow up or explanation, we will call you with instructions. Clinically stable results will be released to your [facility]. If you have not heard from [place] or cannot find your results in [facility] in 2 weeks please contact our office at  [phone].  If you are not yet signed up for [facility], please consider signing up.    Preventive Care 40-64 Years, Female Preventive care refers to lifestyle choices and visits with your health care provider that can promote health and wellness. What does preventive care include?  A yearly physical exam. This is also called an annual well check.  Dental exams once or twice a year.  Routine eye exams. Ask your health care provider how often you should have your eyes checked.  Personal lifestyle choices, including: ? Daily care of your teeth and gums. ? Regular physical activity. ? Eating a healthy diet. ? Avoiding tobacco and drug use. ? Limiting alcohol use. ? Practicing safe sex. ? Taking low-dose aspirin daily starting at age 29. ? Taking vitamin and mineral supplements as recommended by your health care provider. What happens during an annual well check? The services and screenings done by your health care provider during your annual well check will depend on your age, overall health, lifestyle risk factors, and family history of disease. Counseling Your health care provider may ask you questions about your:  Alcohol use.  Tobacco use.  Drug use.  Emotional well-being.  Home and relationship well-being.  Sexual activity.  Eating habits.  Work and work Statistician.  Method of birth control.  Menstrual cycle.  Pregnancy history.  Screening You may have the following tests or measurements:  Height, weight, and BMI.  Blood pressure.  Lipid and cholesterol levels. These may be checked every 5 years, or more frequently if you are over 46 years old.  Skin check.  Lung cancer screening. You may have this screening every year starting at age 45 if you have a 30-pack-year history of smoking and currently smoke or have quit within the past 15 years.  Fecal occult blood test (FOBT) of the stool. You may have this test every year starting at age 63.  Flexible  sigmoidoscopy or colonoscopy. You may have a sigmoidoscopy every 5 years or a colonoscopy every 10 years starting at age 46.  Hepatitis C blood test.  Hepatitis B blood test.  Sexually transmitted disease (STD) testing.  Diabetes screening. This is done by checking your blood sugar (glucose) after you have not eaten for a while (fasting). You may have this done every 1-3 years.  Mammogram. This may be done every 1-2 years. Talk to your health care provider about when you should start having regular mammograms. This may depend on whether you have a family history of breast cancer.  BRCA-related cancer screening. This may be done if you have a family history of breast, ovarian, tubal, or peritoneal cancers.  Pelvic exam and Pap test. This may be done every 3 years starting at age 26. Starting at age 29, this may be done every 5 years if you have a Pap test in combination with an HPV test.  Bone density scan. This is done to screen for osteoporosis. You may have this scan if you are at high risk for osteoporosis.  Discuss your test results, treatment options, and if necessary, the need for more tests with your health care provider. Vaccines Your health care provider may recommend certain vaccines, such as:  Influenza vaccine. This is recommended every year.  Tetanus, diphtheria, and acellular pertussis (Tdap, Td) vaccine. You may need a Td booster every 10 years.  Varicella vaccine. You may need this if you have not been vaccinated.  Zoster vaccine. You may need this after age 51.  Measles, mumps, and rubella (MMR) vaccine. You may need at least one dose of MMR if you were born in 1957 or later. You may also need a second dose.  Pneumococcal 13-valent conjugate (PCV13) vaccine. You may need this if you have certain conditions and were not previously vaccinated.  Pneumococcal polysaccharide (PPSV23) vaccine. You may need one or two doses if you smoke cigarettes or if you have certain  conditions.  Meningococcal vaccine. You may need this if you have certain conditions.  Hepatitis A vaccine. You may need this if you have certain conditions or if you travel or work in places where you may be exposed to hepatitis A.  Hepatitis B vaccine. You may need this if you have certain conditions or if you travel or work in places where you may be exposed to hepatitis B.  Haemophilus influenzae type b (Hib) vaccine. You may need this if you have certain conditions.  Talk to your health care provider about which screenings and vaccines you need and how often you need them. This information is not intended to replace advice given to you by your health care provider. Make sure you discuss any questions you have with your health care provider. Document Released: 10/27/2015 Document Revised: 06/19/2016 Document Reviewed: 08/01/2015 Elsevier Interactive Patient Education  2018 [employer].         No Follow-up on file.  Lucretia Kern, DO

## 2017-11-11 ENCOUNTER — Ambulatory Visit (INDEPENDENT_AMBULATORY_CARE_PROVIDER_SITE_OTHER): Payer: BLUE CROSS/BLUE SHIELD | Admitting: Family Medicine

## 2017-11-11 ENCOUNTER — Encounter: Payer: Self-pay | Admitting: Family Medicine

## 2017-11-11 VITALS — BP 108/80 | HR 73 | Temp 98.1°F | Ht 66.25 in | Wt 191.5 lb

## 2017-11-11 DIAGNOSIS — E785 Hyperlipidemia, unspecified: Secondary | ICD-10-CM

## 2017-11-11 DIAGNOSIS — Z1331 Encounter for screening for depression: Secondary | ICD-10-CM

## 2017-11-11 DIAGNOSIS — Z0000 Encounter for general adult medical examination without abnormal findings: Secondary | ICD-10-CM

## 2017-11-11 DIAGNOSIS — D229 Melanocytic nevi, unspecified: Secondary | ICD-10-CM | POA: Insufficient documentation

## 2017-11-11 DIAGNOSIS — I10 Essential (primary) hypertension: Secondary | ICD-10-CM | POA: Diagnosis not present

## 2017-11-11 LAB — LIPID PANEL
CHOL/HDL RATIO: 4
Cholesterol: 197 mg/dL (ref 0–200)
HDL: 44.6 mg/dL (ref >39.00)
LDL Cholesterol: 114 mg/dL — ABNORMAL HIGH (ref 0–99)
NonHDL: 152.09
TRIGLYCERIDES: 190 mg/dL — AB (ref 0.0–149.0)
VLDL: 38 mg/dL (ref 0.0–40.0)

## 2017-11-11 LAB — HEMOGLOBIN A1C: Hgb A1c MFr Bld: 6 % (ref 4.6–6.5)

## 2017-11-11 LAB — BASIC METABOLIC PANEL
BUN: 17 mg/dL (ref 6–23)
CALCIUM: 10.1 mg/dL (ref 8.4–10.5)
CO2: 29 mEq/L (ref 19–32)
Chloride: 102 mEq/L (ref 96–112)
Creatinine, Ser: 1.1 mg/dL (ref 0.40–1.20)
GFR: 55.05 mL/min — AB (ref >60.00)
GLUCOSE: 96 mg/dL (ref 70–99)
POTASSIUM: 4.1 meq/L (ref 3.5–5.1)
Sodium: 138 mEq/L (ref 135–145)

## 2017-11-11 LAB — CBC
HCT: 39.2 % (ref 36.0–46.0)
HEMOGLOBIN: 13.4 g/dL (ref 12.0–15.0)
MCHC: 34.2 g/dL (ref 30.0–36.0)
MCV: 91.8 fl (ref 78.0–100.0)
Platelets: 297 10*3/uL (ref 150.0–400.0)
RBC: 4.27 Mil/uL (ref 3.87–5.11)
RDW: 13.9 % (ref 11.5–15.5)
WBC: 6.1 10*3/uL (ref 4.0–10.5)

## 2017-11-11 MED ORDER — LISINOPRIL 10 MG PO TABS: 10.0000 mg | ORAL_TABLET | Freq: Every day | ORAL | 3 refills | Status: DC

## 2017-11-11 NOTE — Patient Instructions (Addendum)
BEFORE YOU LEAVE: -Wendie Simmer, please contact her gynecologist for her last Pap smear and mammogram results to update health maintenance -Labs  -follow up: Follow-up 4-6 months  Please decide what she what to do about the colon cancer screening and let us know.  Please contact her gynecologist to set up a GYN exam.  We have ordered labs or studies at this visit. It can take up to 1-2 weeks for results and processing. IF results require follow up or explanation, we will call you with instructions. Clinically stable results will be released to your [facility]. If you have not heard from [place] or cannot find your results in [facility] in 2 weeks please contact our office at [phone].  If you are not yet signed up for [facility], please consider signing up.    Preventive Care 40-64 Years, Female Preventive care refers to lifestyle choices and visits with your health care provider that can promote health and wellness. What does preventive care include?  A yearly physical exam. This is also called an annual well check.  Dental exams once or twice a year.  Routine eye exams. Ask your health care provider how often you should have your eyes checked.  Personal lifestyle choices, including: ? Daily care of your teeth and gums. ? Regular physical activity. ? Eating a healthy diet. ? Avoiding tobacco and drug use. ? Limiting alcohol use. ? Practicing safe sex. ? Taking low-dose aspirin daily starting at age 31. ? Taking vitamin and mineral supplements as recommended by your health care provider. What happens during an annual well check? The services and screenings done by your health care provider during your annual well check will depend on your age, overall health, lifestyle risk factors, and family history of disease. Counseling Your health care provider may ask you questions about your:  Alcohol use.  Tobacco use.  Drug use.  Emotional well-being.  Home and relationship  well-being.  Sexual activity.  Eating habits.  Work and work Statistician.  Method of birth control.  Menstrual cycle.  Pregnancy history.  Screening You may have the following tests or measurements:  Height, weight, and BMI.  Blood pressure.  Lipid and cholesterol levels. These may be checked every 5 years, or more frequently if you are over 84 years old.  Skin check.  Lung cancer screening. You may have this screening every year starting at age 49 if you have a 30-pack-year history of smoking and currently smoke or have quit within the past 15 years.  Fecal occult blood test (FOBT) of the stool. You may have this test every year starting at age 77.  Flexible sigmoidoscopy or colonoscopy. You may have a sigmoidoscopy every 5 years or a colonoscopy every 10 years starting at age 14.  Hepatitis C blood test.  Hepatitis B blood test.  Sexually transmitted disease (STD) testing.  Diabetes screening. This is done by checking your blood sugar (glucose) after you have not eaten for a while (fasting). You may have this done every 1-3 years.  Mammogram. This may be done every 1-2 years. Talk to your health care provider about when you should start having regular mammograms. This may depend on whether you have a family history of breast cancer.  BRCA-related cancer screening. This may be done if you have a family history of breast, ovarian, tubal, or peritoneal cancers.  Pelvic exam and Pap test. This may be done every 3 years starting at age 24. Starting at age 54, this may be done every  5 years if you have a Pap test in combination with an HPV test.  Bone density scan. This is done to screen for osteoporosis. You may have this scan if you are at high risk for osteoporosis.  Discuss your test results, treatment options, and if necessary, the need for more tests with your health care provider. Vaccines Your health care provider may recommend certain vaccines, such  as:  Influenza vaccine. This is recommended every year.  Tetanus, diphtheria, and acellular pertussis (Tdap, Td) vaccine. You may need a Td booster every 10 years.  Varicella vaccine. You may need this if you have not been vaccinated.  Zoster vaccine. You may need this after age 25.  Measles, mumps, and rubella (MMR) vaccine. You may need at least one dose of MMR if you were born in 1957 or later. You may also need a second dose.  Pneumococcal 13-valent conjugate (PCV13) vaccine. You may need this if you have certain conditions and were not previously vaccinated.  Pneumococcal polysaccharide (PPSV23) vaccine. You may need one or two doses if you smoke cigarettes or if you have certain conditions.  Meningococcal vaccine. You may need this if you have certain conditions.  Hepatitis A vaccine. You may need this if you have certain conditions or if you travel or work in places where you may be exposed to hepatitis A.  Hepatitis B vaccine. You may need this if you have certain conditions or if you travel or work in places where you may be exposed to hepatitis B.  Haemophilus influenzae type b (Hib) vaccine. You may need this if you have certain conditions.  Talk to your health care provider about which screenings and vaccines you need and how often you need them. This information is not intended to replace advice given to you by your health care provider. Make sure you discuss any questions you have with your health care provider. Document Released: 10/27/2015 Document Revised: 06/19/2016 Document Reviewed: 08/01/2015 Elsevier Interactive Patient Education  [employer].

## 2017-11-25 ENCOUNTER — Telehealth: Payer: Self-pay | Admitting: Family Medicine

## 2017-11-25 DIAGNOSIS — E669 Obesity, unspecified: Secondary | ICD-10-CM

## 2017-11-25 NOTE — Telephone Encounter (Signed)
Please call patient.  Okay with me to refer for obesity.  I think that Dr. Migdalia Dk program may be more useful to her.

## 2017-11-25 NOTE — Telephone Encounter (Signed)
Called in and was given lab results from 11/11/17 by Dr. Maudie Mercury.  Labs look pretty good for the most part, except for mild prediabetes and elevated cholesterol.  Recommend a healthy low sugar diet and regular aerobic exercise.   The diet will be the most important.   Advise avoiding sugar, sweets, excessive fruits, starches, simple starches in particular.   Mediterranean diet is a good diet for long-term health.  She wanted to know if she could be referred to a nutritionist.

## 2017-11-28 NOTE — Telephone Encounter (Signed)
I left a detailed message the referral was entered for the pt and to call back if she has any questions.

## 2017-11-28 NOTE — Addendum Note (Signed)
Addended by: Agnes Lawrence on: 11/28/2017 11:33 AM   Modules accepted: Orders

## 2018-01-27 DIAGNOSIS — Z1231 Encounter for screening mammogram for malignant neoplasm of breast: Secondary | ICD-10-CM | POA: Diagnosis not present

## 2018-03-06 DIAGNOSIS — H903 Sensorineural hearing loss, bilateral: Secondary | ICD-10-CM | POA: Diagnosis not present

## 2018-04-21 ENCOUNTER — Encounter: Payer: Self-pay | Admitting: Family Medicine

## 2018-05-08 ENCOUNTER — Other Ambulatory Visit: Payer: Self-pay | Admitting: Family Medicine

## 2018-07-21 ENCOUNTER — Other Ambulatory Visit: Payer: Self-pay | Admitting: Family Medicine

## 2018-11-19 ENCOUNTER — Other Ambulatory Visit: Payer: Self-pay | Admitting: Family Medicine

## 2018-12-16 ENCOUNTER — Other Ambulatory Visit: Payer: Self-pay | Admitting: Family Medicine

## 2019-01-10 ENCOUNTER — Other Ambulatory Visit: Payer: Self-pay | Admitting: Family Medicine

## 2019-01-20 ENCOUNTER — Other Ambulatory Visit: Payer: Self-pay | Admitting: Family Medicine

## 2019-01-29 ENCOUNTER — Ambulatory Visit (INDEPENDENT_AMBULATORY_CARE_PROVIDER_SITE_OTHER): Payer: BLUE CROSS/BLUE SHIELD | Admitting: Family Medicine

## 2019-01-29 ENCOUNTER — Encounter: Payer: Self-pay | Admitting: Family Medicine

## 2019-01-29 ENCOUNTER — Other Ambulatory Visit: Payer: Self-pay

## 2019-01-29 DIAGNOSIS — R7301 Impaired fasting glucose: Secondary | ICD-10-CM

## 2019-01-29 DIAGNOSIS — Z1211 Encounter for screening for malignant neoplasm of colon: Secondary | ICD-10-CM

## 2019-01-29 DIAGNOSIS — R232 Flushing: Secondary | ICD-10-CM | POA: Diagnosis not present

## 2019-01-29 DIAGNOSIS — K649 Unspecified hemorrhoids: Secondary | ICD-10-CM

## 2019-01-29 DIAGNOSIS — H9190 Unspecified hearing loss, unspecified ear: Secondary | ICD-10-CM | POA: Diagnosis not present

## 2019-01-29 DIAGNOSIS — I10 Essential (primary) hypertension: Secondary | ICD-10-CM

## 2019-01-29 DIAGNOSIS — E785 Hyperlipidemia, unspecified: Secondary | ICD-10-CM

## 2019-01-29 MED ORDER — LISINOPRIL 10 MG PO TABS: 10.0000 mg | ORAL_TABLET | Freq: Every day | ORAL | 1 refills | Status: DC

## 2019-01-29 NOTE — Progress Notes (Signed)
Virtual Visit via Video Note  I connected with@ on 01/29/19 at  3:00 PM EDT by a video enabled telemedicine application and verified that I am speaking with the correct person using two identifiers.  Location patient: home Location provider:work or home office Persons participating in the virtual visit: patient, provider  I discussed the limitations of evaluation and management by telemedicine and the availability of in person appointments. The patient expressed understanding and agreed to proceed.   Belinda Maxwell DOB: 1965-11-05 Encounter date: 01/29/2019  This isa 53 y.o. female who presents to establish care. Chief Complaint  Patient presents with  . Transitions Of Care    History of present illness:  Has been doing pretty well. Has had a couple of episodes of stomach pain - thinks related to sweet potatoes. Been better this week.   HTN: bp has been running well. Checks on occasion. Doing intermittent fasting and has lost 15-20 lbs with this in last year. Tried to stop lisinopril for a couple of days, but it crept back up so she restarted. 120-130/70-80 with lisinopril.   Migraine:has imitrex prn migraine. Had menstrual migraines for 20 years. Doesn't get them very often - usually less than a couple a month and the imitrex works well for her.   Occasionally has back or leg pain. Has meloxicam if needed for this. Uses on occasion.   Hearing loss - noted in childhood for high pitch. Has had hearing aids for over 30 years now. Just got new pair last summer.  Had fall in 2008 with direct fall on bottom. PT helped her. Had disc misplaced - around L3. Still has back issues if not using good posture, traveling. 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   Past Medical History:  Diagnosis Date  . Headache 02/03/2008   Qualifier: Diagnosis of  By: Leanne Chang MD, Bruce  Menstrual related   . Headache(784.0)   . Hearing loss   . Hemorrhoid 01/11/2016   -sees surgeon in  [place]   . Hot flashes 01/11/2016   -seeing gynecologist   . Hyperlipemia 01/11/2016  . Hypertension 04/19/2010   Qualifier: Diagnosis of  By: Leanne Chang MD, Bruce     . Pre-eclampsia    Past Surgical History:  Procedure Laterality Date  . CHOLECYSTECTOMY    . hemorrhoid banding     Allergies  Allergen Reactions  . Sulfamethoxazole Hives   Current Meds  Medication Sig  . lisinopril (PRINIVIL,ZESTRIL) 10 MG tablet TAKE 1 TABLET BY MOUTH EVERY DAY  . meloxicam (MOBIC) 15 MG tablet TAKE 1 TABLET (15 MG TOTAL) BY MOUTH DAILY AS NEEDED FOR PAIN.  . NON FORMULARY Hormonal supplement with Soy and Black Cohosh  . SUMAtriptan (IMITREX) 100 MG tablet TAKE 1 TABLET BY MOUTH TWICE A DAY AS NEEDED  . triamcinolone cream (KENALOG) 0.1 % Apply 1 application topically 2 (two) times daily.   Social History   Tobacco Use  . Smoking status: Never Smoker  . Smokeless tobacco: Never Used  Substance Use Topics  . Alcohol use: No   Family History  Problem Relation Age of Onset  . Hyperlipidemia Mother   . Hypertension Mother   . Migraines Mother   . Hyperlipidemia Father   . Hypertension Father   . Heart disease Father 24       MI/ CAD with AFIB  . Migraines Sister   . Migraines Son      Review of Systems  Constitutional: Negative for chills, fatigue and fever.  Respiratory: Negative for cough, chest tightness, shortness of breath and wheezing.   Cardiovascular: Negative for chest pain, palpitations and leg swelling.    Objective:  There were no vitals taken for this visit.      BP Readings from Last 3 Encounters:  11/11/17 108/80  12/31/16 120/84  11/15/16 (!) 144/74   Wt Readings from Last 3 Encounters:  11/11/17 191 lb 8 oz (86.9 kg)  12/31/16 189 lb 12.8 oz (86.1 kg)  11/07/16 188 lb (85.3 kg)    EXAM:  GENERAL: alert, oriented, appears well and in no acute distress  HEENT: atraumatic, conjunctiva clear, no obvious abnormalities on inspection of external nose and  ears  NECK: normal movements of the head and neck  LUNGS: on inspection no signs of respiratory distress, breathing rate appears normal, no obvious gross SOB, gasping or wheezing  CV: no obvious cyanosis  MS: moves all visible extremities without noticeable abnormality  PSYCH/NEURO: pleasant and cooperative, no obvious depression or anxiety, speech and thought processing grossly intact   Assessment/Plan  1. Essential hypertension Continue current medication; bloodwork prior to physical. - lisinopril (ZESTRIL) 10 MG tablet; Take 1 tablet (10 mg total) by mouth daily.  Dispense: 90 tablet; Refill: 1  2. Hot flashes Hormonal supplement helps.  3. Hemorrhoids, unspecified hemorrhoid type Well controlled generally with healthier diet.  4. Hearing loss, unspecified hearing loss type, unspecified laterality Wears hearing aids and does well with these. Follows regularly with audiology.  5. Screening for colon cancer Hasn't scheduled colonoscopy; worried about time commitment. Willing to do cologuard. - Cologuard    I discussed the assessment and treatment plan with the patient. The patient was provided an opportunity to ask questions and all were answered. The patient agreed with the plan and demonstrated an understanding of the instructions.   The patient was advised to call back or seek an in-person evaluation if the symptoms worsen or if the condition fails to improve as anticipated.   Micheline Rough, MD  Shingles vaccine in office at next visit. Mole on back has been stable; will recheck at upcoming physical. Schedule visit sooner if change noted.

## 2019-03-25 ENCOUNTER — Other Ambulatory Visit: Payer: Self-pay | Admitting: Family Medicine

## 2019-03-26 ENCOUNTER — Encounter: Payer: Self-pay | Admitting: Family Medicine

## 2019-04-06 ENCOUNTER — Ambulatory Visit (INDEPENDENT_AMBULATORY_CARE_PROVIDER_SITE_OTHER): Payer: BC Managed Care – PPO | Admitting: Family Medicine

## 2019-04-06 ENCOUNTER — Other Ambulatory Visit: Payer: Self-pay

## 2019-04-06 DIAGNOSIS — R198 Other specified symptoms and signs involving the digestive system and abdomen: Secondary | ICD-10-CM | POA: Diagnosis not present

## 2019-04-06 DIAGNOSIS — R0989 Other specified symptoms and signs involving the circulatory and respiratory systems: Secondary | ICD-10-CM

## 2019-04-06 NOTE — Patient Instructions (Signed)
Two weeks of Nexium and dietary changes per handout below.  IF this does not resolve the symptoms, then start 2 weeks of flonase 2 sprays each nostril daily along with a daily Claritin.  Follow up with me or Dr. Ethlyn Gallery in 4 weeks  We ordered the Cologuard test for colon cancer screening. Please complete this test promptly once the kit arrives. Please contact us if you have not received your kit in the next few weeks.    Food Choices for Gastroesophageal Reflux Disease, Adult When you have gastroesophageal reflux disease (GERD), the foods you eat and your eating habits are very important. Choosing the right foods can help ease your discomfort. Think about working with a nutrition specialist (dietitian) to help you make good choices. What are tips for following this plan?  Meals  Choose healthy foods that are low in fat, such as fruits, vegetables, whole grains, low-fat dairy products, and lean meat, fish, and poultry.  Eat small meals often instead of 3 large meals a day. Eat your meals slowly, and in a place where you are relaxed. Avoid bending over or lying down until 2-3 hours after eating.  Avoid eating meals 2-3 hours before bed.  Avoid drinking a lot of liquid with meals.  Cook foods using methods other than frying. Bake, grill, or broil food instead.  Avoid or limit: ? Chocolate. ? Peppermint or spearmint. ? Alcohol. ? Pepper. ? Black and decaffeinated coffee. ? Black and decaffeinated tea. ? Bubbly (carbonated) soft drinks. ? Caffeinated energy drinks and soft drinks.  Limit high-fat foods such as: ? Fatty meat or fried foods. ? Whole milk, cream, butter, or ice cream. ? Nuts and nut butters. ? Pastries, donuts, and sweets made with butter or shortening.  Avoid foods that cause symptoms. These foods may be different for everyone. Common foods that cause symptoms include: ? Tomatoes. ? Oranges, lemons, and limes. ? Peppers. ? Spicy food. ? Onions and garlic. ?  Vinegar. Lifestyle  Maintain a healthy weight. Ask your doctor what weight is healthy for you. If you need to lose weight, work with your doctor to do so safely.  Exercise for at least 30 minutes for 5 or more days each week, or as told by your doctor.  Wear loose-fitting clothes.  Do not smoke. If you need help quitting, ask your doctor.  Sleep with the head of your bed higher than your feet. Use a wedge under the mattress or blocks under the bed frame to raise the head of the bed. Summary  When you have gastroesophageal reflux disease (GERD), food and lifestyle choices are very important in easing your symptoms.  Eat small meals often instead of 3 large meals a day. Eat your meals slowly, and in a place where you are relaxed.  Limit high-fat foods such as fatty meat or fried foods.  Avoid bending over or lying down until 2-3 hours after eating.  Avoid peppermint and spearmint, caffeine, alcohol, and chocolate. This information is not intended to replace advice given to you by your health care provider. Make sure you discuss any questions you have with your health care provider. Document Released: 03/31/2012 Document Revised: 11/05/2016 Document Reviewed: 11/05/2016 Elsevier Interactive Patient Education  2019 [employer].

## 2019-04-06 NOTE — Progress Notes (Addendum)
Virtual Visit via Video Note  I connected with Belinda Maxwell  on 04/06/19 at  6:20 PM EDT by a video enabled telemedicine application and verified that I am speaking with the correct person using two identifiers.  Location patient: home Location provider:work or home office Persons participating in the virtual visit: patient, provider  I discussed the limitations of evaluation and management by telemedicine and the availability of in person appointments. The patient expressed understanding and agreed to proceed.   HPI:  Acute visit for a globus sensation: -started about 3 months ago -initially she thought it was allergies because it was allergy issues -then she thought it might be reflux -some days are fine and then other days she has it -no dysphagia,  -initially caused some hoarseness, but now no hoarseness -also if eats too much she has some discomfort in the upper stomach, the epigastric region -she even thought it might be  -she tried a few days of claritin and may have helped a little -denies fevers, cough, nausea, vomiting, change in bowels, malaise, dysphagia  ROS: See pertinent positives and negatives per HPI.  Past Medical History:  Diagnosis Date  . Headache 02/03/2008   Qualifier: Diagnosis of  By: Leanne Chang MD, Bruce  Menstrual related   . Headache(784.0)   . Hearing loss   . Hemorrhoid 01/11/2016   -sees surgeon in [place]   . Hot flashes 01/11/2016   -seeing gynecologist   . Hyperlipemia 01/11/2016  . Hypertension 04/19/2010   Qualifier: Diagnosis of  By: Leanne Chang MD, Bruce     . Pre-eclampsia     Past Surgical History:  Procedure Laterality Date  . CHOLECYSTECTOMY    . hemorrhoid banding      Family History  Problem Relation Age of Onset  . Hyperlipidemia Mother   . Hypertension Mother   . Migraines Mother   . Glaucoma Mother   . Cataracts Mother   . Macular degeneration Mother   . Diabetes Mother        prediabetic  . Kidney disease Mother   . Obesity Mother    . Hyperlipidemia Father   . Hypertension Father   . Heart disease Father 67       AFIB  . Obesity Father   . Migraines Sister   . Stroke Sister 15       secondary to PFO  . Hypothyroidism Sister   . Migraines Son   . Obesity Maternal Grandmother   . Arthritis Maternal Grandmother   . Alcohol abuse Maternal Grandfather   . Obesity Paternal Grandmother   . Diabetes Paternal Grandmother   . Gallbladder disease Paternal Grandfather     SOCIAL HX: see hpi  Current Outpatient Medications:  .  ibuprofen (ADVIL,MOTRIN) 200 MG tablet, Take 200 mg by mouth every 6 (six) hours as needed.  , Disp: , Rfl:  .  lisinopril (ZESTRIL) 10 MG tablet, Take 1 tablet (10 mg total) by mouth daily., Disp: 90 tablet, Rfl: 1 .  meloxicam (MOBIC) 15 MG tablet, TAKE 1 TABLET (15 MG TOTAL) BY MOUTH DAILY AS NEEDED FOR PAIN., Disp: 30 tablet, Rfl: 0 .  NON FORMULARY, Hormonal supplement with Soy and Black Cohosh, Disp: , Rfl:  .  SUMAtriptan (IMITREX) 100 MG tablet, TAKE 1 TABLET BY MOUTH TWICE A DAY AS NEEDED, Disp: 9 tablet, Rfl: 2 .  triamcinolone cream (KENALOG) 0.1 %, Apply 1 application topically 2 (two) times daily., Disp: 30 g, Rfl: 0  EXAM:  VITALS per patient if applicable:denies fever  GENERAL: alert, oriented, appears well and in no acute distress on initial brief video encounter, then had to change to phone due to tech issues  LUNGS:  No audible sounds of resp distress  PSYCH/NEURO: pleasant and cooperative, no obvious depression or anxiety, speech and thought processing grossly intact  ASSESSMENT AND PLAN:  Discussed the following assessment and plan: More than 50% of > 15 minutes spent in counseling and/or coordinating care.   Globus sensation   Discussed potential etiologies, options for evaluation, treatment options and risks. Will try 2 weeks of nexium 30m and dietary changes first for possible GERD. If that works will try two weeks of flonase 2 sprays each nostril and claritin  daily. These are the most common causes of this symptoms. Follow up in 4 weeks advised. She already has a physical scheduled then with Dr. KEthlyn Gallery If not improving advised she let Dr. KEthlyn Galleryknow or notify me and we would then re-assess for referral for GI or ENT evaluation.  She didn't realize she could still follow up with me. Advise I am happy to see her for virtual follow up or acute care visits.   WJeris Pentareviewing health maintenance she reports she never received the cologuard test. Will notify my assistant to check on this. Advised Arisa to let me know if does not receive the kit in 2 weeks.    I discussed the assessment and treatment plan with the patient. The patient was provided an opportunity to ask questions and all were answered. The patient agreed with the plan and demonstrated an understanding of the instructions.   The patient was advised to call back or seek an in-person evaluation if the symptoms worsen or if the condition fails to improve as anticipated.   Follow up instructions: Advised assistant JWendie Simmerto help patient arrange the following: -check on status of cologuard - she never received it -follow up appointment with me for regular follow up in about 4-5 months (keep CPE with Dr. KEthlyn Gallery  HLucretia Kern DO

## 2019-04-08 ENCOUNTER — Telehealth: Payer: Self-pay | Admitting: *Deleted

## 2019-04-08 NOTE — Telephone Encounter (Signed)
-----   Message from Lucretia Kern, DO sent at 04/06/2019  6:51 PM EDT ----- -check on status of cologuard - she never received it-follow up appointment with me for regular follow up in about 4-5 months (keep CPE with Dr. Ethlyn Gallery)

## 2019-04-08 NOTE — Telephone Encounter (Signed)
I called [employer] [employer] and per Synetta Shadow they do not have an order for Cologuard.  Order form completed and faxed with demographics and insurance to [employer] at [phone].  I called the pt and left a detailed message the order was completed and a kit should be mailed to her home.  I also advised she return a call to schedule a phone visit as below.

## 2019-04-27 ENCOUNTER — Other Ambulatory Visit: Payer: Self-pay

## 2019-04-27 ENCOUNTER — Other Ambulatory Visit (INDEPENDENT_AMBULATORY_CARE_PROVIDER_SITE_OTHER): Payer: BC Managed Care – PPO

## 2019-04-27 DIAGNOSIS — R7301 Impaired fasting glucose: Secondary | ICD-10-CM | POA: Diagnosis not present

## 2019-04-27 DIAGNOSIS — I10 Essential (primary) hypertension: Secondary | ICD-10-CM

## 2019-04-27 DIAGNOSIS — E785 Hyperlipidemia, unspecified: Secondary | ICD-10-CM

## 2019-04-27 LAB — HEMOGLOBIN A1C: Hgb A1c MFr Bld: 5.6 % (ref 4.6–6.5)

## 2019-04-27 LAB — TSH: TSH: 1.41 u[IU]/mL (ref 0.35–4.50)

## 2019-04-27 LAB — CBC WITH DIFFERENTIAL/PLATELET
Basophils Absolute: 0 10*3/uL (ref 0.0–0.1)
Basophils Relative: 0.7 % (ref 0.0–3.0)
Eosinophils Absolute: 0.5 10*3/uL (ref 0.0–0.7)
Eosinophils Relative: 8.2 % — ABNORMAL HIGH (ref 0.0–5.0)
HCT: 39.8 % (ref 36.0–46.0)
Hemoglobin: 13.6 g/dL (ref 12.0–15.0)
Lymphocytes Relative: 39.6 % (ref 12.0–46.0)
Lymphs Abs: 2.3 10*3/uL (ref 0.7–4.0)
MCHC: 34.2 g/dL (ref 30.0–36.0)
MCV: 95.4 fl (ref 78.0–100.0)
Monocytes Absolute: 0.4 10*3/uL (ref 0.1–1.0)
Monocytes Relative: 7.3 % (ref 3.0–12.0)
Neutro Abs: 2.6 10*3/uL (ref 1.4–7.7)
Neutrophils Relative %: 44.2 % (ref 43.0–77.0)
Platelets: 282 10*3/uL (ref 150.0–400.0)
RBC: 4.17 Mil/uL (ref 3.87–5.11)
RDW: 13.2 % (ref 11.5–15.5)
WBC: 5.9 10*3/uL (ref 4.0–10.5)

## 2019-04-27 LAB — LIPID PANEL
Cholesterol: 228 mg/dL — ABNORMAL HIGH (ref 0–200)
HDL: 52.4 mg/dL (ref >39.00)
LDL Cholesterol: 146 mg/dL — ABNORMAL HIGH (ref 0–99)
NonHDL: 175.76
Total CHOL/HDL Ratio: 4
Triglycerides: 151 mg/dL — ABNORMAL HIGH (ref 0.0–149.0)
VLDL: 30.2 mg/dL (ref 0.0–40.0)

## 2019-04-27 LAB — COMPREHENSIVE METABOLIC PANEL
ALT: 23 U/L (ref 0–35)
AST: 22 U/L (ref 0–37)
Albumin: 4.6 g/dL (ref 3.5–5.2)
Alkaline Phosphatase: 59 U/L (ref 39–117)
BUN: 17 mg/dL (ref 6–23)
CO2: 28 mEq/L (ref 19–32)
Calcium: 10.1 mg/dL (ref 8.4–10.5)
Chloride: 103 mEq/L (ref 96–112)
Creatinine, Ser: 0.98 mg/dL (ref 0.40–1.20)
GFR: 58.86 mL/min — ABNORMAL LOW (ref >60.00)
Glucose, Bld: 96 mg/dL (ref 70–99)
Potassium: 4.4 mEq/L (ref 3.5–5.1)
Sodium: 138 mEq/L (ref 135–145)
Total Bilirubin: 0.6 mg/dL (ref 0.2–1.2)
Total Protein: 6.9 g/dL (ref 6.0–8.3)

## 2019-05-07 ENCOUNTER — Encounter: Payer: Self-pay | Admitting: Family Medicine

## 2019-05-07 ENCOUNTER — Ambulatory Visit (INDEPENDENT_AMBULATORY_CARE_PROVIDER_SITE_OTHER): Payer: BC Managed Care – PPO | Admitting: Family Medicine

## 2019-05-07 ENCOUNTER — Telehealth: Payer: Self-pay | Admitting: *Deleted

## 2019-05-07 ENCOUNTER — Other Ambulatory Visit: Payer: Self-pay

## 2019-05-07 VITALS — BP 102/62 | HR 62 | Temp 98.2°F | Ht 67.0 in | Wt 180.8 lb

## 2019-05-07 DIAGNOSIS — Z0000 Encounter for general adult medical examination without abnormal findings: Secondary | ICD-10-CM | POA: Diagnosis not present

## 2019-05-07 DIAGNOSIS — L309 Dermatitis, unspecified: Secondary | ICD-10-CM

## 2019-05-07 DIAGNOSIS — Z23 Encounter for immunization: Secondary | ICD-10-CM | POA: Diagnosis not present

## 2019-05-07 DIAGNOSIS — I10 Essential (primary) hypertension: Secondary | ICD-10-CM

## 2019-05-07 DIAGNOSIS — E785 Hyperlipidemia, unspecified: Secondary | ICD-10-CM | POA: Diagnosis not present

## 2019-05-07 MED ORDER — TRIAMCINOLONE ACETONIDE 0.1 % EX CREA: 1.0000 "application " | TOPICAL_CREAM | Freq: Two times a day (BID) | CUTANEOUS | 1 refills | Status: DC

## 2019-05-07 MED ORDER — LISINOPRIL 10 MG PO TABS: 5.0000 mg | ORAL_TABLET | Freq: Every day | ORAL | 1 refills | Status: DC

## 2019-05-07 NOTE — Progress Notes (Signed)
Belinda Maxwell DOB: 06/05/1966 Encounter date: 05/07/2019  This is a 53 y.o. female who presents for complete physical   History of present illness/Additional concerns: Worried about labs.  Has had rash - thinks poison ivy - started on foot, then neck. Is getting better. Itchy. Using calamine lotion- put triamcinolone on neck when really bad. Also had little black spot - thought might be tick - so little she couldn't tell; might have been scab. Both started end of June. Rash hasn't looked like typical poison ivy, but itchy. Had been in garden.   Has been doing intermittent fasting - has been active.   HTN: taking lisinopril regularly - light headed with bending over and standing back up. Checking bp at work is in lower 110-130 range over 70-80's.   hasn't completed cologuard: didn't hear about this.  mammogram:normal 01/2018 Last pap was 05/2016 and was normal with negative hpv (done by Dr. Nunzio Cobbs through [facility])  Dr. Maudie Mercury had noted mole on back when she was here for last physical - just wanted to get this rechecked.   Past Medical History:  Diagnosis Date  . Headache 02/03/2008   Qualifier: Diagnosis of  By: Leanne Chang MD, Bruce  Menstrual related   . Headache(784.0)   . Hearing loss   . Hemorrhoid 01/11/2016   -sees surgeon in [place]   . Hot flashes 01/11/2016   -seeing gynecologist   . Hyperlipemia 01/11/2016  . Hypertension 04/19/2010   Qualifier: Diagnosis of  By: Leanne Chang MD, Bruce     . Pre-eclampsia    Past Surgical History:  Procedure Laterality Date  . CHOLECYSTECTOMY    . hemorrhoid banding     Allergies  Allergen Reactions  . Sulfamethoxazole Hives   Current Meds  Medication Sig  . ibuprofen (ADVIL,MOTRIN) 200 MG tablet Take 200 mg by mouth every 6 (six) hours as needed.    Marland Kitchen lisinopril (ZESTRIL) 10 MG tablet Take 0.5 tablets (5 mg total) by mouth daily.  . meloxicam (MOBIC) 15 MG tablet TAKE 1 TABLET (15 MG TOTAL) BY MOUTH DAILY AS NEEDED FOR PAIN.  . NON FORMULARY  Hormonal supplement with Soy and Black Cohosh  . SUMAtriptan (IMITREX) 100 MG tablet TAKE 1 TABLET BY MOUTH TWICE A DAY AS NEEDED  . triamcinolone cream (KENALOG) 0.1 % Apply 1 application topically 2 (two) times daily.  . [DISCONTINUED] lisinopril (ZESTRIL) 10 MG tablet Take 1 tablet (10 mg total) by mouth daily.  . [DISCONTINUED] triamcinolone cream (KENALOG) 0.1 % Apply 1 application topically 2 (two) times daily.   Social History   Tobacco Use  . Smoking status: Never Smoker  . Smokeless tobacco: Never Used  Substance Use Topics  . Alcohol use: No   Family History  Problem Relation Age of Onset  . Hyperlipidemia Mother   . Hypertension Mother   . Migraines Mother   . Glaucoma Mother   . Cataracts Mother   . Macular degeneration Mother   . Diabetes Mother        prediabetic  . Kidney disease Mother   . Obesity Mother   . Hyperlipidemia Father   . Hypertension Father   . Heart disease Father 64       AFIB  . Obesity Father   . Migraines Sister   . Stroke Sister 78       secondary to PFO  . Hypothyroidism Sister   . Migraines Son   . Obesity Maternal Grandmother   . Arthritis Maternal Grandmother   .  Alcohol abuse Maternal Grandfather   . Obesity Paternal Grandmother   . Diabetes Paternal Grandmother   . Gallbladder disease Paternal Grandfather      Review of Systems  Constitutional: Negative for activity change, appetite change, chills, fatigue, fever and unexpected weight change.  HENT: Positive for hearing loss (chronic). Negative for congestion, ear pain, sinus pressure, sinus pain, sore throat and trouble swallowing.   Eyes: Negative for pain and visual disturbance.  Respiratory: Negative for cough, chest tightness, shortness of breath and wheezing.   Cardiovascular: Negative for chest pain, palpitations and leg swelling.  Gastrointestinal: Negative for abdominal pain, blood in stool, constipation, diarrhea, nausea and vomiting.  Genitourinary: Negative for  difficulty urinating and menstrual problem.  Musculoskeletal: Negative for arthralgias and back pain.  Skin: Negative for rash.  Neurological: Negative for dizziness, weakness, numbness and headaches.  Hematological: Negative for adenopathy. Does not bruise/bleed easily.  Psychiatric/Behavioral: Negative for sleep disturbance and suicidal ideas. The patient is not nervous/anxious.     CBC:  Lab Results  Component Value Date   WBC 5.9 04/27/2019   HGB 13.6 04/27/2019   HCT 39.8 04/27/2019   MCH 32.8 10/09/2011   MCHC 34.2 04/27/2019   RDW 13.2 04/27/2019   PLT 282.0 04/27/2019   CMP: Lab Results  Component Value Date   NA 138 04/27/2019   K 4.4 04/27/2019   CL 103 04/27/2019   CO2 28 04/27/2019   GLUCOSE 96 04/27/2019   BUN 17 04/27/2019   CREATININE 0.98 04/27/2019   GFRAA 67 (L) 10/09/2011   CALCIUM 10.1 04/27/2019   PROT 6.9 04/27/2019   BILITOT 0.6 04/27/2019   ALKPHOS 59 04/27/2019   ALT 23 04/27/2019   AST 22 04/27/2019   LIPID: Lab Results  Component Value Date   CHOL 228 (H) 04/27/2019   TRIG 151.0 (H) 04/27/2019   HDL 52.40 04/27/2019   LDLCALC 146 (H) 04/27/2019    Objective:  BP 102/62 (BP Location: Left Arm, Patient Position: Sitting, Cuff Size: Large)   Pulse 62   Temp 98.2 F (36.8 C) (Oral)   Ht 5\' 7"  (1.702 m)   Wt 180 lb 12.8 oz (82 kg)   SpO2 97%   BMI 28.32 kg/m   Weight: 180 lb 12.8 oz (82 kg)   BP Readings from Last 3 Encounters:  05/07/19 102/62  11/11/17 108/80  12/31/16 120/84   Wt Readings from Last 3 Encounters:  05/07/19 180 lb 12.8 oz (82 kg)  11/11/17 191 lb 8 oz (86.9 kg)  12/31/16 189 lb 12.8 oz (86.1 kg)    Physical Exam Constitutional:      General: She is not in acute distress.    Appearance: She is well-developed.  HENT:     Head: Normocephalic and atraumatic.     Right Ear: External ear normal.     Left Ear: External ear normal.     Mouth/Throat:     Pharynx: No oropharyngeal exudate.  Eyes:      Conjunctiva/sclera: Conjunctivae normal.     Pupils: Pupils are equal, round, and reactive to light.  Neck:     Musculoskeletal: Normal range of motion and neck supple.     Thyroid: No thyromegaly.  Cardiovascular:     Rate and Rhythm: Normal rate and regular rhythm.     Heart sounds: Normal heart sounds. No murmur. No friction rub. No gallop.   Pulmonary:     Effort: Pulmonary effort is normal.     Breath sounds: Normal breath sounds.  Abdominal:     General: Bowel sounds are normal. There is no distension.     Palpations: Abdomen is soft. There is no mass.     Tenderness: There is no abdominal tenderness. There is no guarding.     Hernia: No hernia is present.  Musculoskeletal: Normal range of motion.        General: No tenderness or deformity.  Lymphadenopathy:     Cervical: No cervical adenopathy.  Skin:    General: Skin is warm and dry.     Findings: No rash.     Comments: Right side of the neck: Skin is slightly thickened and erythematous.  No obvious acute rash.  Left lateral foot: Appears to be healing rash with dry, flaking papular lesions.  It is unclear if these were previously more vesicular in nature.  Dry area is approximately 1-1/2 cm in diameter.  Previously documented concerning skin lesion on the right upper back appears to be a seborrheic keratosis.  There are 2 in this area.  There are other scattered moles, but none appear to be of concern or changing at this time.  There is a small 2 mm raised mole left upper shoulder with slight increased pigment in the center.  Patient states as well has been there for years.  She has not noted any change. It has well defined margin.  Neurological:     Mental Status: She is alert and oriented to person, place, and time.     Deep Tendon Reflexes: Reflexes normal.     Reflex Scores:      Tricep reflexes are 2+ on the right side and 2+ on the left side.      Bicep reflexes are 2+ on the right side and 2+ on the left side.       Brachioradialis reflexes are 2+ on the right side and 2+ on the left side.      Patellar reflexes are 2+ on the right side and 2+ on the left side. Psychiatric:        Speech: Speech normal.        Behavior: Behavior normal.        Thought Content: Thought content normal.     Assessment/Plan: Health Maintenance Due  Topic Date Due  . COLONOSCOPY  01/06/2014  . PAP SMEAR-Modifier  06/07/2019   Health Maintenance reviewed.  1. Preventative health care She is up-to-date with all preventative health care.  She is working on actively losing weight and has lost 11 lbs since last visit. I have encouraged her to continue monitoring her blood pressure at home, as he may be able to decrease lisinopril dose as she continues to lose weight.  Keep up with regular daily physical exercise.  Keep up with healthier eating.  2. Essential hypertension I have asked her to decrease lisinopril to 5 mg daily.  She has had some lightheadedness when she bends over or changes positions.  I suspect this is due to being more hypotensive.  She will continue to monitor at home and let me know how her pressures are running prior to needing next refill on lisinopril. - lisinopril (ZESTRIL) 10 MG tablet; Take 0.5 tablets (5 mg total) by mouth daily.  Dispense: 90 tablet; Refill: 1  3. Hyperlipidemia, unspecified hyperlipidemia type We discussed slightly elevated cholesterol.  This was excised she is healthier and exercising more.  Low-cholesterol diet recommended.  She will keep up with healthy eating and try to limit red meat in her diet.  We will recheck blood work in 6 months prior to her chronic condition visit. - Comprehensive metabolic panel; Future - Lipid panel; Future  4. Need for shingles vaccine Completed today. - Varicella-zoster vaccine IM (Shingrix)  5. Dermatitis Advised triamcinolone on dermatitis twice daily for 2 weeks.  Let me know if this does not resolve.  Return labs in 6 month followed by  CCV.  Micheline Rough, MD

## 2019-05-07 NOTE — Telephone Encounter (Signed)
New order completed for Cologuard testing and faxed to [phone] as the order from 6/25 was not received.  Patient is aware and advised to call back if the kit is not received.

## 2019-05-07 NOTE — Patient Instructions (Addendum)
Decrease lisinopril to 5mg  daily; keep checking pressures at home. Update me in 1 month through mychart how numbers look.    Fat and Cholesterol Restricted Eating Plan Eating a diet that limits fat and cholesterol may help lower your risk for heart disease and other conditions. Your body needs fat and cholesterol for basic functions, but eating too much of these things can be harmful to your health. Your health care provider may order lab tests to check your blood fat (lipid) and cholesterol levels. This helps your health care provider understand your risk for certain conditions and whether you need to make diet changes. Work with your health care provider or dietitian to make an eating plan that is right for you. Your plan includes:  Limit your fat intake to ______% or less of your total calories a day.  Limit your saturated fat intake to ______% or less of your total calories a day.  Limit the amount of cholesterol in your diet to less than _________mg a day.  Eat ___________ g of fiber a day. What are tips for following this plan? General guidelines   If you are overweight, work with your health care provider to lose weight safely. Losing just 5-10% of your body weight can improve your overall health and help prevent diseases such as diabetes and heart disease.  Avoid: ? Foods with added sugar. ? Fried foods. ? Foods that contain partially hydrogenated oils, including stick margarine, some tub margarines, cookies, crackers, and other baked goods.  Limit alcohol intake to no more than 1 drink a day for nonpregnant women and 2 drinks a day for men. One drink equals 12 oz of beer, 5 oz of wine, or 1 oz of hard liquor. Reading food labels  Check food labels for: ? Trans fats, partially hydrogenated oils, or high amounts of saturated fat. Avoid foods that contain saturated fat and trans fat. ? The amount of cholesterol in each serving. Try to eat no more than 200 mg of cholesterol each  day. ? The amount of fiber in each serving. Try to eat at least 20-30 g of fiber each day.  Choose foods with healthy fats, such as: ? Monounsaturated and polyunsaturated fats. These include olive and canola oil, flaxseeds, walnuts, almonds, and seeds. ? Omega-3 fats. These are found in foods such as salmon, mackerel, sardines, tuna, flaxseed oil, and ground flaxseeds.  Choose grain products that have whole grains. Look for the word "whole" as the first word in the ingredient list. Cooking  Cook foods using methods other than frying. Baking, boiling, grilling, and broiling are some healthy options.  Eat more home-cooked food and less restaurant, buffet, and fast food.  Avoid cooking using saturated fats. ? Animal sources of saturated fats include meats, butter, and cream. ? Plant sources of saturated fats include palm oil, palm kernel oil, and coconut oil. Meal planning   At meals, imagine dividing your plate into fourths: ? Fill one-half of your plate with vegetables and green salads. ? Fill one-fourth of your plate with whole grains. ? Fill one-fourth of your plate with lean protein foods.  Eat fish that is high in omega-3 fats at least two times a week.  Eat more foods that contain fiber, such as whole grains, beans, apples, broccoli, carrots, peas, and barley. These foods help promote healthy cholesterol levels in the blood. Recommended foods Grains  Whole grains, such as whole wheat or whole grain breads, crackers, cereals, and pasta. Unsweetened oatmeal, bulgur, barley, quinoa,  or brown rice. Corn or whole wheat flour tortillas. Vegetables  Fresh or frozen vegetables (raw, steamed, roasted, or grilled). Green salads. Fruits  All fresh, canned (in natural juice), or frozen fruits. Meats and other protein foods  Ground beef (85% or leaner), grass-fed beef, or beef trimmed of fat. Skinless chicken or [place]. Ground chicken or [place]. Pork trimmed of fat. All fish and  seafood. Egg whites. Dried beans, peas, or lentils. Unsalted nuts or seeds. Unsalted canned beans. Natural nut butters without added sugar and oil. Dairy  Low-fat or nonfat dairy products, such as skim or 1% milk, 2% or reduced-fat cheeses, low-fat and fat-free ricotta or cottage cheese, or plain low-fat and nonfat yogurt. Fats and oils  Tub margarine without trans fats. Light or reduced-fat mayonnaise and salad dressings. Avocado. Olive, canola, sesame, or safflower oils. The items listed above may not be a complete list of recommended foods or beverages. Contact your dietitian for more options. Foods to avoid Grains  White bread. White pasta. White rice. Cornbread. Bagels, pastries, and croissants. Crackers and snack foods that contain trans fat and hydrogenated oils. Vegetables  Vegetables cooked in cheese, cream, or butter sauce. Fried vegetables. Fruits  Canned fruit in heavy syrup. Fruit in cream or butter sauce. Fried fruit. Meats and other protein foods  Fatty cuts of meat. Ribs, chicken wings, bacon, sausage, bologna, salami, chitterlings, fatback, hot dogs, bratwurst, and packaged lunch meats. Liver and organ meats. Whole eggs and egg yolks. Chicken and [place] with skin. Fried meat. Dairy  Whole or 2% milk, cream, half-and-half, and cream cheese. Whole milk cheeses. Whole-fat or sweetened yogurt. Full-fat cheeses. Nondairy creamers and whipped toppings. Processed cheese, cheese spreads, and cheese curds. Beverages  Alcohol. Sugar-sweetened drinks such as sodas, lemonade, and fruit drinks. Fats and oils  Butter, stick margarine, lard, shortening, ghee, or bacon fat. Coconut, palm kernel, and palm oils. Sweets and desserts  Corn syrup, sugars, honey, and molasses. Candy. Jam and jelly. Syrup. Sweetened cereals. Cookies, pies, cakes, donuts, muffins, and ice cream. The items listed above may not be a complete list of foods and beverages to avoid. Contact your dietitian for  more information. Summary  Your body needs fat and cholesterol for basic functions. However, eating too much of these things can be harmful to your health.  Work with your health care provider and dietitian to follow a diet low in fat and cholesterol. Doing this may help lower your risk for heart disease and other conditions.  Choose healthy fats, such as monounsaturated and polyunsaturated fats, and foods high in omega-3 fatty acids.  Eat fiber-rich foods, such as whole grains, beans, peas, fruits, and vegetables.  Limit or avoid alcohol, fried foods, and foods high in saturated fats, partially hydrogenated oils, and sugar. This information is not intended to replace advice given to you by your health care provider. Make sure you discuss any questions you have with your health care provider. Document Released: 09/30/2005 Document Revised: 09/12/2017 Document Reviewed: 06/17/2017 Elsevier Patient Education  2020 [employer].

## 2019-05-09 ENCOUNTER — Encounter: Payer: Self-pay | Admitting: Family Medicine

## 2019-05-24 DIAGNOSIS — Z1212 Encounter for screening for malignant neoplasm of rectum: Secondary | ICD-10-CM | POA: Diagnosis not present

## 2019-05-24 DIAGNOSIS — Z1211 Encounter for screening for malignant neoplasm of colon: Secondary | ICD-10-CM | POA: Diagnosis not present

## 2019-05-31 LAB — COLOGUARD: Cologuard: NEGATIVE

## 2019-06-08 ENCOUNTER — Encounter: Payer: Self-pay | Admitting: Family Medicine

## 2019-09-03 DIAGNOSIS — R21 Rash and other nonspecific skin eruption: Secondary | ICD-10-CM | POA: Diagnosis not present

## 2019-09-03 DIAGNOSIS — L309 Dermatitis, unspecified: Secondary | ICD-10-CM | POA: Diagnosis not present

## 2019-09-03 DIAGNOSIS — L308 Other specified dermatitis: Secondary | ICD-10-CM | POA: Diagnosis not present

## 2019-09-04 DIAGNOSIS — Z1231 Encounter for screening mammogram for malignant neoplasm of breast: Secondary | ICD-10-CM | POA: Diagnosis not present

## 2019-09-04 LAB — HM MAMMOGRAPHY

## 2019-09-08 DIAGNOSIS — H35362 Drusen (degenerative) of macula, left eye: Secondary | ICD-10-CM | POA: Diagnosis not present

## 2019-09-08 DIAGNOSIS — H524 Presbyopia: Secondary | ICD-10-CM | POA: Diagnosis not present

## 2019-09-08 DIAGNOSIS — H52203 Unspecified astigmatism, bilateral: Secondary | ICD-10-CM | POA: Diagnosis not present

## 2019-09-28 ENCOUNTER — Encounter: Payer: Self-pay | Admitting: Family Medicine

## 2019-10-26 ENCOUNTER — Other Ambulatory Visit: Payer: Self-pay | Admitting: Family Medicine

## 2019-10-26 DIAGNOSIS — I10 Essential (primary) hypertension: Secondary | ICD-10-CM

## 2019-10-29 ENCOUNTER — Other Ambulatory Visit: Payer: Self-pay | Admitting: Family Medicine

## 2019-11-08 ENCOUNTER — Ambulatory Visit: Payer: BC Managed Care – PPO | Admitting: Family Medicine

## 2019-11-12 ENCOUNTER — Telehealth (INDEPENDENT_AMBULATORY_CARE_PROVIDER_SITE_OTHER): Payer: BC Managed Care – PPO | Admitting: Family Medicine

## 2019-11-12 ENCOUNTER — Telehealth: Payer: Self-pay | Admitting: Family Medicine

## 2019-11-12 ENCOUNTER — Other Ambulatory Visit: Payer: Self-pay

## 2019-11-12 DIAGNOSIS — L309 Dermatitis, unspecified: Secondary | ICD-10-CM | POA: Diagnosis not present

## 2019-11-12 DIAGNOSIS — K1379 Other lesions of oral mucosa: Secondary | ICD-10-CM

## 2019-11-12 DIAGNOSIS — E785 Hyperlipidemia, unspecified: Secondary | ICD-10-CM

## 2019-11-12 DIAGNOSIS — I10 Essential (primary) hypertension: Secondary | ICD-10-CM

## 2019-11-12 MED ORDER — ACYCLOVIR 400 MG PO TABS: 400.0000 mg | ORAL_TABLET | Freq: Every day | ORAL | 3 refills | Status: DC

## 2019-11-12 MED ORDER — SUMATRIPTAN SUCCINATE 100 MG PO TABS: 100.0000 mg | ORAL_TABLET | Freq: Two times a day (BID) | ORAL | 5 refills | Status: DC | PRN

## 2019-11-12 MED ORDER — LIDOCAINE VISCOUS HCL 2 % MT SOLN: 5.0000 mL | OROMUCOSAL | 0 refills | Status: DC | PRN

## 2019-11-12 NOTE — Telephone Encounter (Signed)
Pt called for her pre-screening. Pt stated she has been in contact with someone confirmed with COVID within the last week.  Informed pt I have changed it to a virtual appt, but pt states she was suppose to get her shingle shot and would like to know if it needs to be r/s. I stated that they may address it at her virtual appt at 3:45
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
## 2019-11-16 ENCOUNTER — Telehealth: Payer: Self-pay | Admitting: *Deleted

## 2019-11-16 NOTE — Telephone Encounter (Signed)
Left a message for the pt to return my call.  

## 2019-11-16 NOTE — Telephone Encounter (Signed)
-----   Message from Caren Macadam, MD sent at 11/12/2019  7:44 PM EST ----- Please set up bloodwork for patient. I have ordered. I only found one test worthwhile in my reading to add regarding eczema. She also asked if she could get shingles vaccine in location besides arm - in my reading it appears it could be given in anterior-lateral thigh, but this is not typical and it would be preferred to do in the arm. She was asking due to previous localized reaction. I would advise that second one still be given in arm; but that we make good effort to have her very relaxed and then not massage, but apply firm pressure to injection site afterwards (may have had some hematoma with last one).

## 2019-11-18 ENCOUNTER — Telehealth: Payer: Self-pay | Admitting: Family Medicine

## 2019-11-18 NOTE — Telephone Encounter (Signed)
I called the pt and informed her of the message below.  Appt scheduled for 2/12 for labs and second Shingrix.

## 2019-11-18 NOTE — Telephone Encounter (Signed)
Pt is returning call from 11/16/19. Thanks  Pt's # [phone]

## 2019-11-18 NOTE — Telephone Encounter (Signed)
See previous phone note.  

## 2019-11-25 ENCOUNTER — Other Ambulatory Visit: Payer: Self-pay

## 2019-11-26 ENCOUNTER — Ambulatory Visit (INDEPENDENT_AMBULATORY_CARE_PROVIDER_SITE_OTHER): Payer: BC Managed Care – PPO | Admitting: *Deleted

## 2019-11-26 ENCOUNTER — Other Ambulatory Visit (INDEPENDENT_AMBULATORY_CARE_PROVIDER_SITE_OTHER): Payer: BC Managed Care – PPO

## 2019-11-26 DIAGNOSIS — I10 Essential (primary) hypertension: Secondary | ICD-10-CM

## 2019-11-26 DIAGNOSIS — Z23 Encounter for immunization: Secondary | ICD-10-CM | POA: Diagnosis not present

## 2019-11-26 DIAGNOSIS — L309 Dermatitis, unspecified: Secondary | ICD-10-CM | POA: Diagnosis not present

## 2019-11-26 DIAGNOSIS — E785 Hyperlipidemia, unspecified: Secondary | ICD-10-CM | POA: Diagnosis not present

## 2019-11-26 LAB — COMPREHENSIVE METABOLIC PANEL
ALT: 30 U/L (ref 0–35)
AST: 28 U/L (ref 0–37)
Albumin: 4.5 g/dL (ref 3.5–5.2)
Alkaline Phosphatase: 71 U/L (ref 39–117)
BUN: 16 mg/dL (ref 6–23)
CO2: 28 mEq/L (ref 19–32)
Calcium: 10 mg/dL (ref 8.4–10.5)
Chloride: 104 mEq/L (ref 96–112)
Creatinine, Ser: 0.94 mg/dL (ref 0.40–1.20)
GFR: 61.62 mL/min (ref >60.00)
Glucose, Bld: 92 mg/dL (ref 70–99)
Potassium: 4.1 mEq/L (ref 3.5–5.1)
Sodium: 138 mEq/L (ref 135–145)
Total Bilirubin: 0.5 mg/dL (ref 0.2–1.2)
Total Protein: 7.1 g/dL (ref 6.0–8.3)

## 2019-11-26 LAB — CBC WITH DIFFERENTIAL/PLATELET
Basophils Absolute: 0 10*3/uL (ref 0.0–0.1)
Basophils Relative: 0.7 % (ref 0.0–3.0)
Eosinophils Absolute: 0.1 10*3/uL (ref 0.0–0.7)
Eosinophils Relative: 2.8 % (ref 0.0–5.0)
HCT: 39 % (ref 36.0–46.0)
Hemoglobin: 13.3 g/dL (ref 12.0–15.0)
Lymphocytes Relative: 42.3 % (ref 12.0–46.0)
Lymphs Abs: 2 10*3/uL (ref 0.7–4.0)
MCHC: 34.1 g/dL (ref 30.0–36.0)
MCV: 94.9 fl (ref 78.0–100.0)
Monocytes Absolute: 0.4 10*3/uL (ref 0.1–1.0)
Monocytes Relative: 9 % (ref 3.0–12.0)
Neutro Abs: 2.1 10*3/uL (ref 1.4–7.7)
Neutrophils Relative %: 45.2 % (ref 43.0–77.0)
Platelets: 270 10*3/uL (ref 150.0–400.0)
RBC: 4.11 Mil/uL (ref 3.87–5.11)
RDW: 13 % (ref 11.5–15.5)
WBC: 4.7 10*3/uL (ref 4.0–10.5)

## 2019-11-26 LAB — LIPID PANEL
Cholesterol: 213 mg/dL — ABNORMAL HIGH (ref 0–200)
HDL: 54 mg/dL (ref >39.00)
LDL Cholesterol: 129 mg/dL — ABNORMAL HIGH (ref 0–99)
NonHDL: 158.63
Total CHOL/HDL Ratio: 4
Triglycerides: 147 mg/dL (ref 0.0–149.0)
VLDL: 29.4 mg/dL (ref 0.0–40.0)

## 2019-11-26 LAB — TSH: TSH: 1.51 u[IU]/mL (ref 0.35–4.50)

## 2019-11-30 LAB — CELIAC PANEL 10
Antigliadin Abs, IgA: 7 units (ref 0–19)
Endomysial IgA: NEGATIVE
Gliadin IgG: 2 units (ref 0–19)
IgA/Immunoglobulin A, Serum: 176 mg/dL (ref 87–352)
Tissue Transglut Ab: <2 U/mL (ref 0–5)
Transglutaminase IgA: <2 U/mL (ref 0–3)

## 2020-01-15 ENCOUNTER — Other Ambulatory Visit: Payer: Self-pay | Admitting: Family Medicine

## 2020-05-21 ENCOUNTER — Other Ambulatory Visit: Payer: Self-pay | Admitting: Family Medicine

## 2020-05-21 DIAGNOSIS — I10 Essential (primary) hypertension: Secondary | ICD-10-CM

## 2020-05-26 ENCOUNTER — Ambulatory Visit: Payer: BC Managed Care – PPO | Admitting: Family Medicine

## 2020-05-31 ENCOUNTER — Telehealth: Payer: Self-pay | Admitting: Family Medicine

## 2020-05-31 NOTE — Telephone Encounter (Signed)
Pt called and said she has tested positive for COVID and want the following medication called in  Ivermectin and cough medicine  [id number] [address] [facility] [place] [place] [address]  Phone:  [phone] Fax:  [phone]   Please advise

## 2020-05-31 NOTE — Telephone Encounter (Signed)
Spoke with the pt and informed her a virtual visit is needed to determine what medications are needed.  There are no openings available today and an appt was scheduled for tomorrow with Dr Maudie Mercury.

## 2020-06-01 ENCOUNTER — Telehealth (INDEPENDENT_AMBULATORY_CARE_PROVIDER_SITE_OTHER): Payer: BC Managed Care – PPO | Admitting: Family Medicine

## 2020-06-01 ENCOUNTER — Encounter: Payer: Self-pay | Admitting: Nurse Practitioner

## 2020-06-01 ENCOUNTER — Encounter: Payer: Self-pay | Admitting: Family Medicine

## 2020-06-01 VITALS — Temp 97.0°F | Wt 185.0 lb

## 2020-06-01 DIAGNOSIS — I959 Hypotension, unspecified: Secondary | ICD-10-CM | POA: Diagnosis not present

## 2020-06-01 DIAGNOSIS — R059 Cough, unspecified: Secondary | ICD-10-CM

## 2020-06-01 DIAGNOSIS — R05 Cough: Secondary | ICD-10-CM

## 2020-06-01 DIAGNOSIS — I10 Essential (primary) hypertension: Secondary | ICD-10-CM

## 2020-06-01 DIAGNOSIS — E663 Overweight: Secondary | ICD-10-CM

## 2020-06-01 DIAGNOSIS — U071 COVID-19: Secondary | ICD-10-CM

## 2020-06-01 MED ORDER — BENZONATATE 100 MG PO CAPS: 100.0000 mg | ORAL_CAPSULE | Freq: Three times a day (TID) | ORAL | 0 refills | Status: DC | PRN

## 2020-06-01 NOTE — Addendum Note (Signed)
Addended by: Lucretia Kern on: 06/01/2020 01:56 PM   Modules accepted: Level of Service

## 2020-06-01 NOTE — Progress Notes (Signed)
Virtual Visit via Video Note  I connected with Belinda Maxwell  on 06/01/20 at 10:00 AM EDT by a video enabled telemedicine application and verified that I am speaking with the correct person using two identifiers.  Location patient: home, [place] Location provider:work or home office Persons participating in the virtual visit: patient, provider, husband  I discussed the limitations of evaluation and management by telemedicine and the availability of in person appointments. The patient expressed understanding and agreed to proceed.   HPI:  Acute visit for -started 8/12 -initially thought it was allergies -symptoms: congestion, cough, drainage, diarrhea, nausea, fatigue, bodyaches, headaches -Covid test + 05/29/20 -denies fevers, SOB, inability to get out of bed, CP, severe headaches -drinking plenty of fluids, able to eat and get around -not vaccinated for covid19 -PMH obesity and hypertension, current bmi overweight -reports BP has been normal despite holding her antihypertensive since getting sick  ROS: See pertinent positives and negatives per HPI.  Past Medical History:  Diagnosis Date   Headache 02/03/2008   Qualifier: Diagnosis of  By: Leanne Chang MD, Bruce  Menstrual related    Headache(784.0)    Hearing loss    Hemorrhoid 01/11/2016   -sees surgeon in [place] flashes 01/11/2016   -seeing gynecologist    Hyperlipemia 01/11/2016   Hypertension 04/19/2010   Qualifier: Diagnosis of  By: Leanne Chang MD, Bruce      Pre-eclampsia     Past Surgical History:  Procedure Laterality Date   CHOLECYSTECTOMY     hemorrhoid banding      Family History  Problem Relation Age of Onset   Hyperlipidemia Mother    Hypertension Mother    Migraines Mother    Glaucoma Mother    Cataracts Mother    Macular degeneration Mother    Diabetes Mother        prediabetic   Kidney disease Mother    Obesity Mother    Hyperlipidemia Father    Hypertension Father    Heart disease Father  45       AFIB   Obesity Father    Migraines Sister    Stroke Sister 61       secondary to PFO   Hypothyroidism Sister    Migraines Son    Obesity Maternal Grandmother    Arthritis Maternal Grandmother    Alcohol abuse Maternal Grandfather    Obesity Paternal Grandmother    Diabetes Paternal Grandmother    Gallbladder disease Paternal Grandfather     SOCIAL HX: see hpi   Current Outpatient Medications:    acyclovir (ZOVIRAX) 400 MG tablet, Take 1 tablet (400 mg total) by mouth 5 (five) times daily. At onset of rash, Disp: 75 tablet, Rfl: 3   ibuprofen (ADVIL,MOTRIN) 200 MG tablet, Take 200 mg by mouth every 6 (six) hours as needed.  , Disp: , Rfl:    lidocaine (XYLOCAINE) 2 % solution, Use as directed 5 mLs in the mouth or throat every 4 (four) hours as needed for mouth pain. Swish and spit, Disp: 100 mL, Rfl: 0   lisinopril (ZESTRIL) 10 MG tablet, TAKE 1 TABLET BY MOUTH EVERY DAY, Disp: 30 tablet, Rfl: 2   meloxicam (MOBIC) 15 MG tablet, TAKE 1 TABLET (15 MG TOTAL) BY MOUTH DAILY AS NEEDED FOR PAIN., Disp: 30 tablet, Rfl: 0   NON FORMULARY, Hormonal supplement with Soy and Black Cohosh, Disp: , Rfl:    SUMAtriptan (IMITREX) 100 MG tablet, TAKE 1 TABLET BY MOUTH TWICE A DAY AS NEEDED, Disp:  9 tablet, Rfl: 0   triamcinolone cream (KENALOG) 0.1 %, Apply 1 application topically 2 (two) times daily., Disp: 30 g, Rfl: 1   benzonatate (TESSALON PERLES) 100 MG capsule, Take 1 capsule (100 mg total) by mouth 3 (three) times daily as needed., Disp: 20 capsule, Rfl: 0  EXAM:  VITALS per patient if applicable: Vitals:   49/82/64 0845  Temp: (!) 97 F (36.1 C)  Body mass index is 28.98 kg/m.    GENERAL: alert, oriented, appears well and in no acute distress  HEENT: atraumatic, conjunttiva clear, no obvious abnormalities on inspection of external nose and ears  NECK: normal movements of the head and neck  LUNGS: on inspection no signs of respiratory distress,  breathing rate appears normal, no obvious gross SOB, gasping or wheezing  CV: no obvious cyanosis  MS: moves all visible extremities without noticeable abnormality  PSYCH/NEURO: pleasant and cooperative, no obvious depression or anxiety, speech and thought processing grossly intact  ASSESSMENT AND PLAN:  Discussed the following assessment and plan:  2019 novel coronavirus disease (COVID-19) Cough -discussed implications, potential complications treatment options, isolation per cdc -opted for symptomatic care with tessalon rx for cough sent, prn analgesics, nasal saline, vit D, C -advised mab and provider treatment center number to call and also sent message to mam infusions in Epic regarding this patient -advised follow up early next week and sent message to PCP office to schedule -advised to seek follow up sooner if worsening or any concerns and to seek inperson care if any severe symptoms  Essential hypertension Hypotension, unspecified hypotension type -risk facot for COVID19 -discussed benefits of the antihypertensive vs balancing if BP low, she has opted to hold on antihypertensive while sick unless BP goes above 130/80 per pt preference as she feels is running low and she is afraid to take it right now  Overweight -risk factor for COVID19  -we discussed possible serious and likely etiologies, options for evaluation and workup, limitations of telemedicine visit vs in person visit, treatment, treatment risks and precautions. Pt prefers to treat via telemedicine empirically rather then risking or undertaking an in person visit at this moment.  Work/School slipped offered: Advised to seek prompt follow up telemedicine visit or in person care if worsening, new symptoms arise, or if is not improving with treatment. Did let her know that I only do telemedicine on Tuesdays and Thursdays for [place] and advised follow up visit with PCP or [facility] if needs follow up or if any further questions  arise to avoid any delays.   I discussed the assessment and treatment plan with the patient. The patient was provided an opportunity to ask questions and all were answered. The patient agreed with the plan and demonstrated an understanding of the instructions.   The patient was advised to call back or seek an in-person evaluation if the symptoms worsen or if the condition fails to improve as anticipated.   Lucretia Kern, DO

## 2020-06-01 NOTE — Patient Instructions (Addendum)
-  I sent the medication(s) we discussed to your pharmacy: Meds ordered this encounter  Medications  . benzonatate (TESSALON PERLES) 100 MG capsule    Sig: Take 1 capsule (100 mg total) by mouth 3 (three) times daily as needed.    Dispense:  20 capsule    Refill:  0   Call the [facility] outpatient treatment center for antibody treatment for [place]. I also sent them a message. [phone].  Can use tylenol or aleve if needed per instructions.  Nasal saline.  Stay home, unless to seek medical care, for a full 10 days plu 1 day of feeling better and 1 days of improving symptoms.  I hope you are feeling better soon! Seek care promptly if your symptoms worsen, new concerns arise or you are not improving with treatment.   -see the CDC site for information:   [email or web address].za.html

## 2020-06-02 ENCOUNTER — Ambulatory Visit (HOSPITAL_COMMUNITY)
Admission: RE | Admit: 2020-06-02 | Discharge: 2020-06-02 | Disposition: A | Discharge status: Home / Self Care | Payer: BC Managed Care – PPO | Source: Ambulatory Visit | Attending: Pulmonary Disease | Admitting: Pulmonary Disease

## 2020-06-02 ENCOUNTER — Encounter: Payer: Self-pay | Admitting: Nurse Practitioner

## 2020-06-02 ENCOUNTER — Other Ambulatory Visit (HOSPITAL_COMMUNITY): Payer: Self-pay | Admitting: Nurse Practitioner

## 2020-06-02 DIAGNOSIS — U071 COVID-19: Secondary | ICD-10-CM

## 2020-06-02 DIAGNOSIS — D72828 Other elevated white blood cell count: Secondary | ICD-10-CM | POA: Diagnosis not present

## 2020-06-02 DIAGNOSIS — J1282 Pneumonia due to coronavirus disease 2019: Secondary | ICD-10-CM | POA: Diagnosis not present

## 2020-06-02 DIAGNOSIS — T380X5A Adverse effect of glucocorticoids and synthetic analogues, initial encounter: Secondary | ICD-10-CM | POA: Diagnosis not present

## 2020-06-02 DIAGNOSIS — Z6830 Body mass index (BMI) 30.0-30.9, adult: Secondary | ICD-10-CM | POA: Diagnosis not present

## 2020-06-02 DIAGNOSIS — J189 Pneumonia, unspecified organism: Secondary | ICD-10-CM | POA: Diagnosis not present

## 2020-06-02 DIAGNOSIS — Z79899 Other long term (current) drug therapy: Secondary | ICD-10-CM | POA: Diagnosis not present

## 2020-06-02 DIAGNOSIS — R110 Nausea: Secondary | ICD-10-CM | POA: Diagnosis not present

## 2020-06-02 DIAGNOSIS — R0602 Shortness of breath: Secondary | ICD-10-CM | POA: Diagnosis not present

## 2020-06-02 DIAGNOSIS — R7401 Elevation of levels of liver transaminase levels: Secondary | ICD-10-CM | POA: Diagnosis not present

## 2020-06-02 DIAGNOSIS — R748 Abnormal levels of other serum enzymes: Secondary | ICD-10-CM | POA: Diagnosis not present

## 2020-06-02 DIAGNOSIS — J9601 Acute respiratory failure with hypoxia: Secondary | ICD-10-CM | POA: Diagnosis not present

## 2020-06-02 DIAGNOSIS — I10 Essential (primary) hypertension: Secondary | ICD-10-CM | POA: Diagnosis not present

## 2020-06-02 DIAGNOSIS — D72818 Other decreased white blood cell count: Secondary | ICD-10-CM | POA: Diagnosis not present

## 2020-06-02 DIAGNOSIS — E669 Obesity, unspecified: Secondary | ICD-10-CM | POA: Diagnosis not present

## 2020-06-02 DIAGNOSIS — E785 Hyperlipidemia, unspecified: Secondary | ICD-10-CM | POA: Diagnosis not present

## 2020-06-02 MED ORDER — FAMOTIDINE IN NACL 20-0.9 MG/50ML-% IV SOLN: 20.0000 mg | Freq: Once | INTRAVENOUS | Status: DC | PRN

## 2020-06-02 MED ORDER — METHYLPREDNISOLONE SODIUM SUCC 125 MG IJ SOLR: 125.0000 mg | Freq: Once | INTRAMUSCULAR | Status: DC | PRN

## 2020-06-02 MED ORDER — DIPHENHYDRAMINE HCL 50 MG/ML IJ SOLN: 50.0000 mg | Freq: Once | INTRAMUSCULAR | Status: DC | PRN

## 2020-06-02 MED ORDER — EPINEPHRINE 0.3 MG/0.3ML IJ SOAJ: 0.3000 mg | Freq: Once | INTRAMUSCULAR | Status: DC | PRN

## 2020-06-02 MED ORDER — ALBUTEROL SULFATE HFA 108 (90 BASE) MCG/ACT IN AERS: 2.0000 | INHALATION_SPRAY | Freq: Once | RESPIRATORY_TRACT | Status: DC | PRN

## 2020-06-02 MED ORDER — SODIUM CHLORIDE 0.9 % IV SOLN: INTRAVENOUS | Status: DC | PRN

## 2020-06-02 MED ORDER — SODIUM CHLORIDE 0.9 % IV SOLN
1200.0000 mg | Freq: Once | INTRAVENOUS | Status: AC
  Administered 2020-06-02: 1200 mg via INTRAVENOUS
  Filled 2020-06-02: qty 10

## 2020-06-02 NOTE — Progress Notes (Signed)
  Diagnosis: COVID-19  Physician:Dr P Wright  Procedure: Covid Infusion Clinic Med: casirivimab\imdevimab infusion - Provided patient with casirivimab\imdevimab fact sheet for patients, parents and caregivers prior to infusion.  Complications: No immediate complications noted.  Discharge: Discharged home   Belinda Maxwell 06/02/2020

## 2020-06-02 NOTE — Progress Notes (Signed)
I connected by phone with Belinda Maxwell on 06/02/2020 at 9:35 AM to discuss the potential use of an new treatment for mild to moderate COVID-19 viral infection in non-hospitalized patients.  This patient is a 54 y.o. female that meets the FDA criteria for Emergency Use Authorization of casirivimab\imdevimab.  Has a (+) direct SARS-CoV-2 viral test result  Has mild or moderate COVID-19   Is ? 56 years of age and weighs ? 40 kg  Is NOT hospitalized due to COVID-19  Is NOT requiring oxygen therapy or requiring an increase in baseline oxygen flow rate due to COVID-19  Is within 10 days of symptom onset  Has at least one of the high risk factor(s) for progression to severe COVID-19 and/or hospitalization as defined in EUA.  Specific high risk criteria : Cardiovascular disease or hypertension, bmi  Sx onset 05/25/20  I have spoken and communicated the following to the patient or parent/caregiver:  1. FDA has authorized the emergency use of casirivimab\imdevimab for the treatment of mild to moderate COVID-19 in adults and pediatric patients with positive results of direct SARS-CoV-2 viral testing who are 56 years of age and older weighing at least 40 kg, and who are at high risk for progressing to severe COVID-19 and/or hospitalization.  2. The significant known and potential risks and benefits of casirivimab\imdevimab, and the extent to which such potential risks and benefits are unknown.  3. Information on available alternative treatments and the risks and benefits of those alternatives, including clinical trials.  4. Patients treated with casirivimab\imdevimab should continue to self-isolate and use infection control measures (e.g., wear mask, isolate, social distance, avoid sharing personal items, clean and disinfect "high touch" surfaces, and frequent handwashing) according to CDC guidelines.   5. The patient or parent/caregiver has the option to accept or refuse casirivimab\imdevimab  .  After reviewing this information with the patient, The patient agreed to proceed with receiving casirivimab\imdevimab infusion and will be provided a copy of the Fact sheet prior to receiving the infusion.Beckey Rutter, [place], AGNP-C [phone] ([place])

## 2020-06-02 NOTE — Discharge Instructions (Signed)

## 2020-06-03 ENCOUNTER — Encounter (HOSPITAL_COMMUNITY): Payer: Self-pay

## 2020-06-03 ENCOUNTER — Inpatient Hospital Stay (HOSPITAL_COMMUNITY)
Admission: EM | Admit: 2020-06-03 | Discharge: 2020-06-07 | DRG: 177 | Disposition: A | Discharge status: Home / Self Care | Payer: BC Managed Care – PPO | Attending: Internal Medicine | Admitting: Internal Medicine

## 2020-06-03 ENCOUNTER — Other Ambulatory Visit: Payer: Self-pay

## 2020-06-03 ENCOUNTER — Emergency Department (HOSPITAL_COMMUNITY): Payer: BC Managed Care – PPO

## 2020-06-03 DIAGNOSIS — I10 Essential (primary) hypertension: Secondary | ICD-10-CM | POA: Diagnosis present

## 2020-06-03 DIAGNOSIS — T380X5A Adverse effect of glucocorticoids and synthetic analogues, initial encounter: Secondary | ICD-10-CM | POA: Diagnosis not present

## 2020-06-03 DIAGNOSIS — U071 COVID-19: Principal | ICD-10-CM | POA: Diagnosis present

## 2020-06-03 DIAGNOSIS — J1282 Pneumonia due to coronavirus disease 2019: Secondary | ICD-10-CM | POA: Diagnosis present

## 2020-06-03 DIAGNOSIS — D72828 Other elevated white blood cell count: Secondary | ICD-10-CM | POA: Diagnosis not present

## 2020-06-03 DIAGNOSIS — R748 Abnormal levels of other serum enzymes: Secondary | ICD-10-CM | POA: Diagnosis not present

## 2020-06-03 DIAGNOSIS — R7401 Elevation of levels of liver transaminase levels: Secondary | ICD-10-CM | POA: Diagnosis not present

## 2020-06-03 DIAGNOSIS — E785 Hyperlipidemia, unspecified: Secondary | ICD-10-CM | POA: Diagnosis present

## 2020-06-03 DIAGNOSIS — D72818 Other decreased white blood cell count: Secondary | ICD-10-CM | POA: Diagnosis present

## 2020-06-03 DIAGNOSIS — R110 Nausea: Secondary | ICD-10-CM | POA: Diagnosis not present

## 2020-06-03 DIAGNOSIS — Z6830 Body mass index (BMI) 30.0-30.9, adult: Secondary | ICD-10-CM | POA: Diagnosis not present

## 2020-06-03 DIAGNOSIS — E669 Obesity, unspecified: Secondary | ICD-10-CM | POA: Diagnosis present

## 2020-06-03 DIAGNOSIS — Z79899 Other long term (current) drug therapy: Secondary | ICD-10-CM

## 2020-06-03 DIAGNOSIS — J9601 Acute respiratory failure with hypoxia: Secondary | ICD-10-CM | POA: Diagnosis present

## 2020-06-03 LAB — CBC WITH DIFFERENTIAL/PLATELET
Abs Immature Granulocytes: 0.02 10*3/uL (ref 0.00–0.07)
Basophils Absolute: 0 10*3/uL (ref 0.0–0.1)
Basophils Relative: 0 %
Eosinophils Absolute: 0 10*3/uL (ref 0.0–0.5)
Eosinophils Relative: 0 %
HCT: 38.6 % (ref 36.0–46.0)
Hemoglobin: 13 g/dL (ref 12.0–15.0)
Immature Granulocytes: 0 %
Lymphocytes Relative: 14 %
Lymphs Abs: 0.8 10*3/uL (ref 0.7–4.0)
MCH: 31.6 pg (ref 26.0–34.0)
MCHC: 33.7 g/dL (ref 30.0–36.0)
MCV: 93.7 fL (ref 80.0–100.0)
Monocytes Absolute: 0.3 10*3/uL (ref 0.1–1.0)
Monocytes Relative: 5 %
Neutro Abs: 4.5 10*3/uL (ref 1.7–7.7)
Neutrophils Relative %: 81 %
Platelets: 168 10*3/uL (ref 150–400)
RBC: 4.12 MIL/uL (ref 3.87–5.11)
RDW: 13.2 % (ref 11.5–15.5)
WBC: 5.6 10*3/uL (ref 4.0–10.5)
nRBC: 0 % (ref 0.0–0.2)

## 2020-06-03 LAB — COMPREHENSIVE METABOLIC PANEL
ALT: 62 U/L — ABNORMAL HIGH (ref 0–44)
AST: 128 U/L — ABNORMAL HIGH (ref 15–41)
Albumin: 3.5 g/dL (ref 3.5–5.0)
Alkaline Phosphatase: 100 U/L (ref 38–126)
Anion gap: 10 (ref 5–15)
BUN: 14 mg/dL (ref 6–20)
CO2: 24 mmol/L (ref 22–32)
Calcium: 9.1 mg/dL (ref 8.9–10.3)
Chloride: 102 mmol/L (ref 98–111)
Creatinine, Ser: 0.88 mg/dL (ref 0.44–1.00)
GFR calc Af Amer: >60 mL/min (ref >60)
GFR calc non Af Amer: >60 mL/min (ref >60)
Glucose, Bld: 118 mg/dL — ABNORMAL HIGH (ref 70–99)
Potassium: 3.9 mmol/L (ref 3.5–5.1)
Sodium: 136 mmol/L (ref 135–145)
Total Bilirubin: 0.6 mg/dL (ref 0.3–1.2)
Total Protein: 7.3 g/dL (ref 6.5–8.1)

## 2020-06-03 LAB — D-DIMER, QUANTITATIVE: D-Dimer, Quant: 2.13 ug/mL-FEU — ABNORMAL HIGH (ref 0.00–0.50)

## 2020-06-03 LAB — ABO/RH: ABO/RH(D): A POS

## 2020-06-03 LAB — HIV ANTIBODY (ROUTINE TESTING W REFLEX): HIV Screen 4th Generation wRfx: NONREACTIVE

## 2020-06-03 LAB — FIBRINOGEN: Fibrinogen: 643 mg/dL — ABNORMAL HIGH (ref 210–475)

## 2020-06-03 LAB — LACTATE DEHYDROGENASE: LDH: 592 U/L — ABNORMAL HIGH (ref 98–192)

## 2020-06-03 LAB — C-REACTIVE PROTEIN: CRP: 8.8 mg/dL — ABNORMAL HIGH (ref <1.0)

## 2020-06-03 LAB — FERRITIN: Ferritin: 1730 ng/mL — ABNORMAL HIGH (ref 11–307)

## 2020-06-03 LAB — LACTIC ACID, PLASMA
Lactic Acid, Venous: 1.2 mmol/L (ref 0.5–1.9)
Lactic Acid, Venous: 1.2 mmol/L (ref 0.5–1.9)

## 2020-06-03 LAB — PROCALCITONIN: Procalcitonin: <0.1 ng/mL

## 2020-06-03 LAB — I-STAT BETA HCG BLOOD, ED (MC, WL, AP ONLY): I-stat hCG, quantitative: <5 m[IU]/mL (ref <5)

## 2020-06-03 LAB — TRIGLYCERIDES: Triglycerides: 150 mg/dL — ABNORMAL HIGH (ref <150)

## 2020-06-03 MED ORDER — ONDANSETRON HCL 4 MG/2ML IJ SOLN
4.0000 mg | Freq: Four times a day (QID) | INTRAMUSCULAR | Status: DC | PRN
  Administered 2020-06-05: 4 mg via INTRAVENOUS
  Filled 2020-06-03: qty 2

## 2020-06-03 MED ORDER — ASCORBIC ACID 500 MG PO TABS
500.0000 mg | ORAL_TABLET | Freq: Every day | ORAL | Status: DC
  Administered 2020-06-03 – 2020-06-07 (×5): 500 mg via ORAL
  Filled 2020-06-03 (×5): qty 1

## 2020-06-03 MED ORDER — BISACODYL 5 MG PO TBEC
5.0000 mg | DELAYED_RELEASE_TABLET | Freq: Every day | ORAL | Status: DC | PRN
  Filled 2020-06-03: qty 1

## 2020-06-03 MED ORDER — ACETAMINOPHEN 325 MG PO TABS
650.0000 mg | ORAL_TABLET | Freq: Four times a day (QID) | ORAL | Status: DC | PRN
  Administered 2020-06-04 – 2020-06-07 (×5): 650 mg via ORAL
  Filled 2020-06-03 (×5): qty 2

## 2020-06-03 MED ORDER — ZOLPIDEM TARTRATE 5 MG PO TABS
5.0000 mg | ORAL_TABLET | Freq: Every evening | ORAL | Status: DC | PRN
  Administered 2020-06-04 – 2020-06-06 (×3): 5 mg via ORAL
  Filled 2020-06-03 (×3): qty 1

## 2020-06-03 MED ORDER — POLYETHYLENE GLYCOL 3350 17 G PO PACK
17.0000 g | PACK | Freq: Every day | ORAL | Status: DC | PRN
  Filled 2020-06-03: qty 1

## 2020-06-03 MED ORDER — ONDANSETRON HCL 4 MG PO TABS: 4.0000 mg | ORAL_TABLET | Freq: Four times a day (QID) | ORAL | Status: DC | PRN

## 2020-06-03 MED ORDER — PANTOPRAZOLE SODIUM 40 MG PO TBEC
40.0000 mg | DELAYED_RELEASE_TABLET | Freq: Every day | ORAL | Status: DC
  Administered 2020-06-03 – 2020-06-07 (×5): 40 mg via ORAL
  Filled 2020-06-03 (×5): qty 1

## 2020-06-03 MED ORDER — SODIUM CHLORIDE 0.9 % IV SOLN
100.0000 mg | Freq: Every day | INTRAVENOUS | Status: AC
  Administered 2020-06-04 – 2020-06-06 (×3): 100 mg via INTRAVENOUS
  Filled 2020-06-03 (×3): qty 20

## 2020-06-03 MED ORDER — MAGNESIUM CITRATE PO SOLN: 1.0000 | Freq: Once | ORAL | Status: DC | PRN

## 2020-06-03 MED ORDER — SODIUM CHLORIDE 0.9 % IV SOLN
200.0000 mg | Freq: Once | INTRAVENOUS | Status: AC
  Administered 2020-06-03: 200 mg via INTRAVENOUS
  Filled 2020-06-03: qty 200

## 2020-06-03 MED ORDER — HYDROCOD POLST-CPM POLST ER 10-8 MG/5ML PO SUER
5.0000 mL | Freq: Two times a day (BID) | ORAL | Status: DC | PRN
  Administered 2020-06-03 – 2020-06-06 (×4): 5 mL via ORAL
  Filled 2020-06-03 (×5): qty 5

## 2020-06-03 MED ORDER — GUAIFENESIN-DM 100-10 MG/5ML PO SYRP
10.0000 mL | ORAL_SOLUTION | ORAL | Status: DC | PRN
  Administered 2020-06-03 – 2020-06-05 (×2): 10 mL via ORAL
  Filled 2020-06-03 (×2): qty 10

## 2020-06-03 MED ORDER — METHYLPREDNISOLONE SODIUM SUCC 125 MG IJ SOLR
0.5000 mg/kg | Freq: Two times a day (BID) | INTRAMUSCULAR | Status: DC
  Administered 2020-06-03 – 2020-06-07 (×8): 43.125 mg via INTRAVENOUS
  Filled 2020-06-03 (×8): qty 2

## 2020-06-03 MED ORDER — OXYCODONE HCL 5 MG PO TABS
5.0000 mg | ORAL_TABLET | ORAL | Status: DC | PRN
  Filled 2020-06-03 (×2): qty 1

## 2020-06-03 MED ORDER — ZINC SULFATE 220 (50 ZN) MG PO CAPS
220.0000 mg | ORAL_CAPSULE | Freq: Every day | ORAL | Status: DC
  Administered 2020-06-03 – 2020-06-07 (×5): 220 mg via ORAL
  Filled 2020-06-03 (×5): qty 1

## 2020-06-03 MED ORDER — DEXAMETHASONE SODIUM PHOSPHATE 10 MG/ML IJ SOLN
10.0000 mg | Freq: Once | INTRAMUSCULAR | Status: AC
  Administered 2020-06-03: 10 mg via INTRAVENOUS
  Filled 2020-06-03: qty 1

## 2020-06-03 MED ORDER — IPRATROPIUM-ALBUTEROL 20-100 MCG/ACT IN AERS
1.0000 | INHALATION_SPRAY | Freq: Four times a day (QID) | RESPIRATORY_TRACT | Status: DC
  Administered 2020-06-03 – 2020-06-07 (×13): 1 via RESPIRATORY_TRACT
  Filled 2020-06-03 (×3): qty 4

## 2020-06-03 MED ORDER — LISINOPRIL 10 MG PO TABS
10.0000 mg | ORAL_TABLET | Freq: Every day | ORAL | Status: DC
  Administered 2020-06-03: 10 mg via ORAL
  Filled 2020-06-03: qty 1

## 2020-06-03 MED ORDER — ENOXAPARIN SODIUM 40 MG/0.4ML ~~LOC~~ SOLN
40.0000 mg | SUBCUTANEOUS | Status: DC
  Administered 2020-06-03 – 2020-06-06 (×4): 40 mg via SUBCUTANEOUS
  Filled 2020-06-03 (×4): qty 0.4

## 2020-06-03 NOTE — H&P (Signed)
History and Physical    Belinda Maxwell YWV:[id number] DOB: Nov 03, 1965 DOA: 06/03/2020  PCP: Belinda Macadam, MD Patient coming from: Home.  Chief Complaint: Shortness of breath  HPI: Belinda Maxwell is a 54 y.o. female with history of HTN and hyperlipidemia presenting with progressive shortness of breath for 3 days.   Patient has had dry cough, nasal congestion, nausea, diarrhea, headache fatigue and myalgia since May 25, 2020.  She tested positive for COVID-19 on May 29, 2020 at [place].  Then she developed shortness of breath 3 days ago.  She received monoclonal antibody infusion clinic yesterday. However, dyspnea gotten worse.  She desaturated to 70% climbing stairs at home that prompted her to come to ED.  She denies chest pain.  She reports pain across her upper abdomen with cough.  She is still have watery diarrhea, about 3 episodes today.  She denies blood in stool.  She denies vision change, UTI symptoms or focal neuro symptoms.  She is not vaccinated against COVID-19.  Two of her children tested positive for COVID-19.  Husband with symptoms and waiting on test results.  She denies history of heart disease, lung disease, kidney disease, diabetes or stroke.  Patient denies smoking cigarettes, drinking alcohol recreational drug use.  Wishes to remain full code.  In ED, hemodynamically stable.  Desaturated to 88%.  Recovered to 95 with 2 L by nasal cannula.   AST 128.  ALT 62.  LDH 592.  CBC without significant finding.  Lactic acid 1.2.  Chest x-ray with bilateral infiltrate.  COVID-19 PCR and inflammatory marker labs pending.  Started on Decadron.  Hospitalist service called for admission.   ROS All review of system negative except for pertinent positives and negatives as history of present illness above.  PMH Past Medical History:  Diagnosis Date  . Headache 02/03/2008   Qualifier: Diagnosis of  By: Leanne Chang MD, Bruce  Menstrual related   . Headache(784.0)   .  Hearing loss   . Hemorrhoid 01/11/2016   -sees surgeon in [place]   . Hot flashes 01/11/2016   -seeing gynecologist   . Hyperlipemia 01/11/2016  . Hypertension 04/19/2010   Qualifier: Diagnosis of  By: Leanne Chang MD, Bruce     . Pre-eclampsia    PSH Past Surgical History:  Procedure Laterality Date  . CHOLECYSTECTOMY    . hemorrhoid banding     Fam HX Family History  Problem Relation Age of Onset  . Hyperlipidemia Mother   . Hypertension Mother   . Migraines Mother   . Glaucoma Mother   . Cataracts Mother   . Macular degeneration Mother   . Diabetes Mother        prediabetic  . Kidney disease Mother   . Obesity Mother   . Hyperlipidemia Father   . Hypertension Father   . Heart disease Father 73       AFIB  . Obesity Father   . Migraines Sister   . Stroke Sister 50       secondary to PFO  . Hypothyroidism Sister   . Migraines Son   . Obesity Maternal Grandmother   . Arthritis Maternal Grandmother   . Alcohol abuse Maternal Grandfather   . Obesity Paternal Grandmother   . Diabetes Paternal Grandmother   . Gallbladder disease Paternal Grandfather     Social Hx  reports that she has never smoked. She has never used smokeless tobacco. She reports that she does not drink alcohol and does not use drugs.  Allergy Allergies  Allergen Reactions  . Sulfamethoxazole Hives   Home Meds Prior to Admission medications   Medication Sig Start Date End Date Taking? Authorizing Provider  acyclovir (ZOVIRAX) 400 MG tablet Take 1 tablet (400 mg total) by mouth 5 (five) times daily. At onset of rash Patient taking differently: Take 400 mg by mouth 5 (five) times daily as needed (onset of cold sores).  11/12/19  Yes Koberlein, Steele Berg, MD  benzonatate (TESSALON PERLES) 100 MG capsule Take 1 capsule (100 mg total) by mouth 3 (three) times daily as needed. 06/01/20  Yes Lucretia Kern, DO  DM-Doxylamine-Acetaminophen (NYQUIL COLD & FLU PO) Take 1 tablet by mouth 2 (two) times daily as  needed (cold symptoms).   Yes [provider]  ibuprofen (ADVIL,MOTRIN) 200 MG tablet Take 200 mg by mouth every 6 (six) hours as needed.     Yes [provider]  lisinopril (ZESTRIL) 10 MG tablet TAKE 1 TABLET BY MOUTH EVERY DAY Patient taking differently: Take 10 mg by mouth daily.  05/22/20  Yes Koberlein, Steele Berg, MD  meloxicam (MOBIC) 15 MG tablet TAKE 1 TABLET (15 MG TOTAL) BY MOUTH DAILY AS NEEDED FOR PAIN. 05/21/16  Yes Colin Benton R, DO  Pseudoephedrine-APAP-DM (DAYQUIL MULTI-SYMPTOM COLD/FLU PO) Take 1 tablet by mouth 2 (two) times daily as needed (cold symptoms).   Yes [provider]  SUMAtriptan (IMITREX) 100 MG tablet TAKE 1 TABLET BY MOUTH TWICE A DAY AS NEEDED Patient taking differently: Take 100 mg by mouth 2 (two) times daily as needed for migraine.  01/17/20  Yes Belinda Macadam, MD    Physical Exam: Vitals:   06/03/20 1228 06/03/20 1235 06/03/20 1256 06/03/20 1430  BP: 118/78   (!) 145/87  Pulse: 91  91 84  Resp: 18   16  Temp: 99.5 F (37.5 C)     TempSrc: Oral     SpO2: (!) 88%  91% 95%  Weight:  86.2 kg    Height:  5\' 6"  (1.676 m)      GENERAL: No acute distress.  Appears well.  HEENT: MMM.  Vision and hearing grossly intact.  NECK: Supple.  No apparent JVD.  RESP:  No IWOB.  Fair air movement.  Bilateral crackles. CVS:  RRR. Heart sounds normal.  ABD/GI/GU: Bowel sounds present. Soft. Non tender.  MSK/EXT:  Moves extremities. No apparent deformity or edema.  SKIN: no apparent skin lesion or wound NEURO: Awake, alert and oriented appropriately.  No gross deficit.  PSYCH: Calm. Normal affect.   Personally Reviewed Radiological Exams DG Chest Port 1 View  Result Date: 06/03/2020 CLINICAL DATA:  COVID positive on Monday EXAM: PORTABLE CHEST 1 VIEW COMPARISON:  October 09, 2011, most recent comparison FINDINGS: Trachea is midline. Cardiomediastinal contours are normal accounting for low lung volumes, partially obscured by patchy  bilateral airspace disease. Hilar structures are normal. Bilateral patchy and peripheral airspace opacities with basilar and mid chest predominance. No sign of pleural effusion. On limited assessment skeletal structures without acute process. IMPRESSION: Patchy bilateral airspace opacities compatible with multifocal pneumonia in the setting of COVID-19 infection. Electronically Signed   By: Zetta Bills M.D.   On: 06/03/2020 14:33     Personally Reviewed Labs: CBC: Recent Labs  Lab 06/03/20 1343  WBC 5.6  NEUTROABS 4.5  HGB 13.0  HCT 38.6  MCV 93.7  PLT 185   Basic Metabolic Panel: Recent Labs  Lab 06/03/20 1343  NA 136  K 3.9  CL  102  CO2 24  GLUCOSE 118*  BUN 14  CREATININE 0.88  CALCIUM 9.1   GFR: Estimated Creatinine Clearance: 79 mL/min (by C-G formula based on SCr of 0.88 mg/dL). Liver Function Tests: Recent Labs  Lab 06/03/20 1343  AST 128*  ALT 62*  ALKPHOS 100  BILITOT 0.6  PROT 7.3  ALBUMIN 3.5   No results for input(s): LIPASE, AMYLASE in the last 168 hours. No results for input(s): AMMONIA in the last 168 hours. Coagulation Profile: No results for input(s): INR, PROTIME in the last 168 hours. Cardiac Enzymes: No results for input(s): CKTOTAL, CKMB, CKMBINDEX, TROPONINI in the last 168 hours. BNP (last 3 results) No results for input(s): PROBNP in the last 8760 hours. HbA1C: No results for input(s): HGBA1C in the last 72 hours. CBG: No results for input(s): GLUCAP in the last 168 hours. Lipid Profile: Recent Labs    06/03/20 1343  TRIG 150*   Thyroid Function Tests: No results for input(s): TSH, T4TOTAL, FREET4, T3FREE, THYROIDAB in the last 72 hours. Anemia Panel: Recent Labs    06/03/20 1343  FERRITIN 1,730*   Urine analysis:    Component Value Date/Time   COLORURINE AMBER (A) 10/09/2011 0800   APPEARANCEUR CLOUDY (A) 10/09/2011 0800   LABSPEC 1.019 10/09/2011 0800   PHURINE 6.0 10/09/2011 0800   GLUCOSEU NEGATIVE 10/09/2011  0800   HGBUR NEGATIVE 10/09/2011 0800   BILIRUBINUR NEGATIVE 10/09/2011 0800   KETONESUR TRACE (A) 10/09/2011 0800   PROTEINUR 30 (A) 10/09/2011 0800   UROBILINOGEN 0.2 10/09/2011 0800   NITRITE NEGATIVE 10/09/2011 0800   LEUKOCYTESUR MODERATE (A) 10/09/2011 0800    Sepsis Labs:  Lactic acid negative.  Personally Reviewed EKG:  12-lead EKG with normal sinus rhythm  Assessment/Plan Active Problems:   Acute hypoxemic respiratory failure due to COVID-19 [facility]) Pneumonia due to COVID-19 Symptomatic for 9 days, and getting worse.  Tested positive on 05/29/2020 at [facility].  Desaturated to 70% at home and 88% in ED requiring supplemental oxygen.  CXR with bilateral infiltrate consistent with COVID-19.  Received monoclonal antibody at infusion clinic on 06/02/2020. -Received Decadron 10 mg in ED -Proceed with IV Solu-Medrol and remdesivir -Verbally consented to Actemra/Olumiant if worse -Supportive care with inhalers, antitussive, mucolytic's and incentive spirometry -OOB/PT/OT  Elevated liver enzymes: Likely due to COVID-19. -Continue monitoring  Essential hypertension: Normotensive. -Continue home lisinopril.   DVT prophylaxis: Subcu Lovenox  Code Status: Full code Family Communication: Updated patient's husband over the phone. Disposition Plan: Admit to MedSurg Consults called: None Admission status: Inpatient.  Patient with progressive dyspnea and hypoxemia due to COVID-19 pneumonia.  It is my judgment that she would benefit inpatient stay for more than 2 days before she can be safely released   Mercy Riding MD [facility]  If 7PM-7AM, please contact night-coverage [email or web address]  06/03/2020, 4:28 PM

## 2020-06-03 NOTE — ED Notes (Signed)
Pt was moved over on to hospital bed for comfort by NT.

## 2020-06-03 NOTE — ED Notes (Signed)
Pt spo2 89% on 2L, increased O2 to 3L [place] at this time

## 2020-06-03 NOTE — ED Provider Notes (Signed)
[facility] DEPT Provider Note   CSN: [id number] Arrival date & time: 06/03/20  1213     History Chief Complaint  Patient presents with  . Shortness of Breath  . Cough  . Diarrhea    Belinda Maxwell is a 54 y.o. female with history of hypertension, hyperlipidemia presenting for evaluation of acute onset, progressively worsening shortness of breath for 3 days.  She started developing Covid symptoms on 05/25/2020 with symptoms of cough, congestion, diarrhea, nausea, fatigue, body aches, and headaches.  She tested positive for Covid on 05/29/2020.  Over the last 3 days she has noted shortness of breath especially with exertion.  Her O2 saturations have been as low as 70% on room air after going up a flight of steps at home.  Denies chest pain.  Has had some epigastric abdominal pain.  No vomiting.  She has had a few episodes of watery nonbloody diarrhea daily.  Denies urinary symptoms, hematuria, melena or hematochezia.  Has been taking DayQuil and NyQuil at home. She has not been vaccinated for COVID.   The history is provided by the patient.       Past Medical History:  Diagnosis Date  . Headache 02/03/2008   Qualifier: Diagnosis of  By: Leanne Chang MD, Bruce  Menstrual related   . Headache(784.0)   . Hearing loss   . Hemorrhoid 01/11/2016   -sees surgeon in [place]   . Hot flashes 01/11/2016   -seeing gynecologist   . Hyperlipemia 01/11/2016  . Hypertension 04/19/2010   Qualifier: Diagnosis of  By: Leanne Chang MD, Bruce     . Pre-eclampsia     Patient Active Problem List   Diagnosis Date Noted  . Acute hypoxemic respiratory failure due to COVID-19 ([facility]) 06/03/2020  . Nevus 11/11/2017  . Arthritis of carpometacarpal (CMC) joints of both thumbs 11/07/2016  . Lumbar radiculopathy, right 08/06/2016  . Piriformis syndrome of right side 07/09/2016  . Acute lateral meniscal injury of right knee 06/11/2016  . Hot flashes 01/11/2016  . Hemorrhoid 01/11/2016  .  Hearing loss 01/11/2016  . Hyperlipemia 01/11/2016  . Hypertension 04/19/2010    Past Surgical History:  Procedure Laterality Date  . CHOLECYSTECTOMY    . hemorrhoid banding       OB History   No obstetric history on file.     Family History  Problem Relation Age of Onset  . Hyperlipidemia Mother   . Hypertension Mother   . Migraines Mother   . Glaucoma Mother   . Cataracts Mother   . Macular degeneration Mother   . Diabetes Mother        prediabetic  . Kidney disease Mother   . Obesity Mother   . Hyperlipidemia Father   . Hypertension Father   . Heart disease Father 58       AFIB  . Obesity Father   . Migraines Sister   . Stroke Sister 56       secondary to PFO  . Hypothyroidism Sister   . Migraines Son   . Obesity Maternal Grandmother   . Arthritis Maternal Grandmother   . Alcohol abuse Maternal Grandfather   . Obesity Paternal Grandmother   . Diabetes Paternal Grandmother   . Gallbladder disease Paternal Grandfather     Social History   Tobacco Use  . Smoking status: Never Smoker  . Smokeless tobacco: Never Used  Substance Use Topics  . Alcohol use: No  . Drug use: No    Home Medications Prior  to Admission medications   Medication Sig Start Date End Date Taking? Authorizing Provider  acyclovir (ZOVIRAX) 400 MG tablet Take 1 tablet (400 mg total) by mouth 5 (five) times daily. At onset of rash Patient taking differently: Take 400 mg by mouth 5 (five) times daily as needed (onset of cold sores).  11/12/19  Yes Koberlein, Steele Berg, MD  benzonatate (TESSALON PERLES) 100 MG capsule Take 1 capsule (100 mg total) by mouth 3 (three) times daily as needed. 06/01/20  Yes Lucretia Kern, DO  DM-Doxylamine-Acetaminophen (NYQUIL COLD & FLU PO) Take 1 tablet by mouth 2 (two) times daily as needed (cold symptoms).   Yes [provider]  ibuprofen (ADVIL,MOTRIN) 200 MG tablet Take 200 mg by mouth every 6 (six) hours as needed.     Yes [provider]    lisinopril (ZESTRIL) 10 MG tablet TAKE 1 TABLET BY MOUTH EVERY DAY Patient taking differently: Take 10 mg by mouth daily.  05/22/20  Yes Koberlein, Steele Berg, MD  meloxicam (MOBIC) 15 MG tablet TAKE 1 TABLET (15 MG TOTAL) BY MOUTH DAILY AS NEEDED FOR PAIN. 05/21/16  Yes Colin Benton R, DO  Pseudoephedrine-APAP-DM (DAYQUIL MULTI-SYMPTOM COLD/FLU PO) Take 1 tablet by mouth 2 (two) times daily as needed (cold symptoms).   Yes [provider]  SUMAtriptan (IMITREX) 100 MG tablet TAKE 1 TABLET BY MOUTH TWICE A DAY AS NEEDED Patient taking differently: Take 100 mg by mouth 2 (two) times daily as needed for migraine.  01/17/20  Yes Caren Macadam, MD    Allergies    Sulfamethoxazole  Review of Systems   Review of Systems  Constitutional: Positive for fatigue. Negative for fever.  Respiratory: Positive for cough and shortness of breath.   Cardiovascular: Negative for chest pain.  Gastrointestinal: Positive for abdominal pain, diarrhea and nausea.  Neurological: Positive for headaches.  All other systems reviewed and are negative.   Physical Exam Updated Vital Signs BP (!) 145/87   Pulse 84   Temp 99.5 F (37.5 C) (Oral)   Resp 16   Ht 5\' 6"  (1.676 m)   Wt 86.2 kg   SpO2 95%   BMI 30.67 kg/m   Physical Exam Vitals and nursing note reviewed.  Constitutional:      General: She is not in acute distress.    Appearance: She is well-developed.  HENT:     Head: Normocephalic and atraumatic.  Eyes:     General:        Right eye: No discharge.        Left eye: No discharge.     Conjunctiva/sclera: Conjunctivae normal.  Neck:     Vascular: No JVD.     Trachea: No tracheal deviation.  Cardiovascular:     Rate and Rhythm: Normal rate and regular rhythm.     Pulses: Normal pulses.  Pulmonary:     Effort: Pulmonary effort is normal.     Breath sounds: Examination of the right-middle field reveals rales. Examination of the left-middle field reveals rales. Examination of the  right-lower field reveals rales. Examination of the left-lower field reveals rales. Rales present.     Comments: SPO2 saturations 88% on room air, improved to 96% on 2 L via nasal cannula Abdominal:     General: Bowel sounds are normal. There is no distension.     Palpations: Abdomen is soft.     Tenderness: There is no guarding or rebound.  Skin:    Findings: No erythema.  Neurological:  Mental Status: She is alert.  Psychiatric:        Behavior: Behavior normal.     ED Results / Procedures / Treatments   Labs (all labs ordered are listed, but only abnormal results are displayed) Labs Reviewed  COMPREHENSIVE METABOLIC PANEL - Abnormal; Notable for the following components:      Result Value   Glucose, Bld 118 (*)    AST 128 (*)    ALT 62 (*)    All other components within normal limits  D-DIMER, QUANTITATIVE (NOT AT [facility]) - Abnormal; Notable for the following components:   D-Dimer, Quant 2.13 (*)    All other components within normal limits  LACTATE DEHYDROGENASE - Abnormal; Notable for the following components:   LDH 592 (*)    All other components within normal limits  FIBRINOGEN - Abnormal; Notable for the following components:   Fibrinogen 643 (*)    All other components within normal limits  CULTURE, BLOOD (ROUTINE X 2)  CULTURE, BLOOD (ROUTINE X 2)  SARS CORONAVIRUS 2 BY RT PCR (HOSPITAL ORDER, [facility])  LACTIC ACID, PLASMA  CBC WITH DIFFERENTIAL/PLATELET  LACTIC ACID, PLASMA  PROCALCITONIN  FERRITIN  TRIGLYCERIDES  C-REACTIVE PROTEIN  I-STAT BETA HCG BLOOD, ED ([facility], [facility], [facility] ONLY)    EKG EKG Interpretation  Date/Time:  Saturday June 03 2020 13:44:22 EDT Ventricular Rate:  86 PR Interval:    QRS Duration: 86 QT Interval:  343 QTC Calculation: 411 R Axis:   39 Text Interpretation: Sinus rhythm Low voltage, precordial leads RSR' in V1 or V2, probably normal variant No STEMI Confirmed by Octaviano Glow [phone]) on 06/03/2020  1:51:55 PM   Radiology DG Chest Port 1 View  Result Date: 06/03/2020 CLINICAL DATA:  COVID positive on Monday EXAM: PORTABLE CHEST 1 VIEW COMPARISON:  October 09, 2011, most recent comparison FINDINGS: Trachea is midline. Cardiomediastinal contours are normal accounting for low lung volumes, partially obscured by patchy bilateral airspace disease. Hilar structures are normal. Bilateral patchy and peripheral airspace opacities with basilar and mid chest predominance. No sign of pleural effusion. On limited assessment skeletal structures without acute process. IMPRESSION: Patchy bilateral airspace opacities compatible with multifocal pneumonia in the setting of COVID-19 infection. Electronically Signed   By: Zetta Bills M.D.   On: 06/03/2020 14:33    Procedures .Critical Care Performed by: Renita Papa, PA-C Authorized by: Renita Papa, PA-C   Critical care provider statement:    Critical care time (minutes):  45   Critical care was necessary to treat or prevent imminent or life-threatening deterioration of the following conditions:  Respiratory failure   Critical care was time spent personally by me on the following activities:  Discussions with consultants, evaluation of patient's response to treatment, examination of patient, ordering and performing treatments and interventions, ordering and review of laboratory studies, ordering and review of radiographic studies, pulse oximetry, re-evaluation of patient's condition, obtaining history from patient or surrogate and review of old charts   (including critical care time)  Medications Ordered in ED Medications  dexamethasone (DECADRON) injection 10 mg (10 mg Intravenous Given 06/03/20 1345)    ED Course  I have reviewed the triage vital signs and the nursing notes.  Pertinent labs & imaging results that were available during my care of the patient were reviewed by me and considered in my medical decision making (see chart for  details).  Clinical Course as of Jun 04 1503  Sat Jun 03, 2020  17 Is a 63-year female with a history of hypertension presenting to the ED with Covid illness for the past 9 days.  The patient has multiple family members sick with Covid in the house.  The patient and her family is not vaccinated.  She came into the ED today complaining of dyspnea.  Her home pulse ox was reading 84 to 85% with exertion.  She normally does not have any pulmonary issues, does not smoke, does not use CPAP.  She reports no other significant medical problem except for high blood pressure.  She does report that she received a monoclonal antibody infusion yesterday.  She has not been on steroids or any other medications for Covid.  Here in the ED the patient was 80% ambulating into the room.  On 2 L nasal cannula and while sitting she is stable at 95%.  She does not appear to be in acute respiratory distress.  Her labs are pending.  Anticipate she will need admission for hypoxia and her oxygen requirement.  The patient was informed of this and is agreeable.  We will give IV Decadron and consider remdesivir if appropriate.   [MT]    Clinical Course User Index [MT] Trifan, Carola Rhine, MD   MDM Rules/Calculators/A&P                          Belinda Maxwell was evaluated in Emergency Department on 06/03/2020 for the symptoms described in the history of present illness. She was evaluated in the context of the global COVID-19 pandemic, which necessitated consideration that the patient might be at risk for infection with the SARS-CoV-2 virus that causes COVID-19. Institutional protocols and algorithms that pertain to the evaluation of patients at risk for COVID-19 are in a state of rapid change based on information released by regulatory bodies including the CDC and federal and state organizations. These policies and algorithms were followed during the patient's care in the ED.  Patient presenting for evaluation of progressively  worsening shortness of breath for 3 days particularly with exertion.  Known to be Covid positive.  Received her first monoclonal antibody infusion yesterday.  SPO2 saturations down to 88% on room air in the ED, she reports they have been as low as 70% with exertion but mostly maintaining in the 80s at home.  She is a non-smoker, no history of asthma, COPD, or other chronic pulmonary disease. SPO2 saturations improved on 2 L supplemental oxygen via nasal cannula. She is unvaccinated.  Chest x-ray shows multifocal pneumonia consistent with COVID-19 diagnosis. EKG shows normal sinus rhythm, no acute ischemic abnormalities. Inflammatory markers are elevated consistent with history of COVID-19 infection. Remainder of lab work reviewed and interpreted by myself shows no leukocytosis, no anemia, no metabolic derangements or renal insufficiency. Her LFTs are mildly elevated also consistent with COVID-19 infection. Plan for admission to the hospital due to new oxygen requirement. She was given IV Decadron in the ED.  Spoke with Dr. Cyndia Skeeters with [facility] hospitalist service who agrees to assume care of patient and bring her into the hospital for further evaluation and management.  Final Clinical Impression(s) / ED Diagnoses Final diagnoses:  Acute hypoxemic respiratory failure due to COVID-19 [facility])  Pneumonia due to COVID-19 virus    Rx / DC Orders ED Discharge Orders    None       Renita Papa, PA-C 06/03/20 1506    Wyvonnia Dusky, MD 06/03/20 [phone]

## 2020-06-03 NOTE — ED Notes (Signed)
Called patient placement regarding pt having a positive covid test from an outside facility which she has on her phone.  Checking with patient placement if we have to re-test her at this time or if a bed will be assigned to her without the test.

## 2020-06-03 NOTE — ED Notes (Signed)
Pt placed on 2L [place]

## 2020-06-03 NOTE — ED Triage Notes (Signed)
Pt reports she went for COVID testing on Monday and tested positive. Pt states that she had the antibody infusion here yesterday. Pt is reporting chills, SHOB, cough and abd pain. Pt states her O2 level is also in the mid 80's. Pt took Dayquil at 1200.

## 2020-06-04 DIAGNOSIS — R7401 Elevation of levels of liver transaminase levels: Secondary | ICD-10-CM

## 2020-06-04 LAB — CBC WITH DIFFERENTIAL/PLATELET
Abs Immature Granulocytes: 0.02 10*3/uL (ref 0.00–0.07)
Basophils Absolute: 0 10*3/uL (ref 0.0–0.1)
Basophils Relative: 0 %
Eosinophils Absolute: 0 10*3/uL (ref 0.0–0.5)
Eosinophils Relative: 0 %
HCT: 36.7 % (ref 36.0–46.0)
Hemoglobin: 12.1 g/dL (ref 12.0–15.0)
Immature Granulocytes: 1 %
Lymphocytes Relative: 18 %
Lymphs Abs: 0.6 10*3/uL — ABNORMAL LOW (ref 0.7–4.0)
MCH: 31 pg (ref 26.0–34.0)
MCHC: 33 g/dL (ref 30.0–36.0)
MCV: 94.1 fL (ref 80.0–100.0)
Monocytes Absolute: 0.1 10*3/uL (ref 0.1–1.0)
Monocytes Relative: 3 %
Neutro Abs: 2.7 10*3/uL (ref 1.7–7.7)
Neutrophils Relative %: 78 %
Platelets: 196 10*3/uL (ref 150–400)
RBC: 3.9 MIL/uL (ref 3.87–5.11)
RDW: 13.4 % (ref 11.5–15.5)
WBC: 3.4 10*3/uL — ABNORMAL LOW (ref 4.0–10.5)
nRBC: 0 % (ref 0.0–0.2)

## 2020-06-04 LAB — COMPREHENSIVE METABOLIC PANEL
ALT: 61 U/L — ABNORMAL HIGH (ref 0–44)
AST: 107 U/L — ABNORMAL HIGH (ref 15–41)
Albumin: 3.2 g/dL — ABNORMAL LOW (ref 3.5–5.0)
Alkaline Phosphatase: 96 U/L (ref 38–126)
Anion gap: 11 (ref 5–15)
BUN: 18 mg/dL (ref 6–20)
CO2: 24 mmol/L (ref 22–32)
Calcium: 9.3 mg/dL (ref 8.9–10.3)
Chloride: 104 mmol/L (ref 98–111)
Creatinine, Ser: 0.88 mg/dL (ref 0.44–1.00)
GFR calc Af Amer: >60 mL/min (ref >60)
GFR calc non Af Amer: >60 mL/min (ref >60)
Glucose, Bld: 145 mg/dL — ABNORMAL HIGH (ref 70–99)
Potassium: 4.2 mmol/L (ref 3.5–5.1)
Sodium: 139 mmol/L (ref 135–145)
Total Bilirubin: 0.5 mg/dL (ref 0.3–1.2)
Total Protein: 7.1 g/dL (ref 6.5–8.1)

## 2020-06-04 LAB — D-DIMER, QUANTITATIVE: D-Dimer, Quant: 2.83 ug/mL-FEU — ABNORMAL HIGH (ref 0.00–0.50)

## 2020-06-04 LAB — C-REACTIVE PROTEIN: CRP: 7.3 mg/dL — ABNORMAL HIGH (ref <1.0)

## 2020-06-04 NOTE — ED Notes (Signed)
IS and flutter valve given to pt and gave instructions.  Pt did both 10X. Pt has HA, gave tylenol

## 2020-06-04 NOTE — ED Notes (Signed)
[facility] called and IS and flutter valve ordered

## 2020-06-04 NOTE — Progress Notes (Signed)
PROGRESS NOTE  Belinda Maxwell JAS:[id number] DOB: May 16, 1966 DOA: 06/03/2020  PCP: Caren Macadam, MD  Brief History/Interval Summary: 54 y.o. female with history of HTN and hyperlipidemia presented with progressive shortness of breath for 3 days. She tested positive for COVID-19 on May 29, 2020 at [facility].  Then she developed shortness of breath 3 days prior to admission.  She received monoclonal antibody infusion clinic on 8/20. However, dyspnea gotten worse.  She desaturated to 70% climbing stairs at home that prompted her to come to ED.  Chest x-ray with bilateral infiltrate.    Patient was hospitalized for further management.  Reason for Visit: Pneumonia due to COVID-19.  Acute respiratory failure with hypoxia  Consultants: None  Procedures: None  Antibiotics: Anti-infectives (From admission, onward)   Start     Dose/Rate Route Frequency Ordered Stop   06/04/20 1000  remdesivir 100 mg in sodium chloride 0.9 % 100 mL IVPB       "Followed by" Linked Group Details   100 mg 200 mL/hr over 30 Minutes Intravenous Daily 06/03/20 1600 06/08/20 0959   06/03/20 1800  remdesivir 200 mg in sodium chloride 0.9% 250 mL IVPB       "Followed by" Linked Group Details   200 mg 580 mL/hr over 30 Minutes Intravenous  Once 06/03/20 1600 06/03/20 1752      Subjective/Interval History: Patient continues to have difficulty breathing though it is no worse than yesterday.  Has a dry cough.  Denies any nausea vomiting.  Does have a history of migraine headaches and currently has a headache.  Denies any chest pain.    Assessment/Plan:  Acute Hypoxic Resp. Failure/Pneumonia due to COVID-19  Recent Labs  Lab 06/03/20 1343 06/04/20 0544  DDIMER 2.13* 2.83*  FERRITIN 1,730*  --   CRP 8.8* 7.3*  ALT 62* 61*  PROCALCITON <0.10  --     Objective findings: Fever: Noted to be afebrile Oxygen requirements: On nasal cannula 3 L/min.  Saturating in the late 80s early 90s.  COVID 19  Therapeutics: Antibacterials: None.  Procalcitonin was less than 0.1 Remdesivir: Day 2 Steroids: Solu-Medrol twice daily Diuretics: No diuretics on a scheduled basis Actemra/Baricitinib: None yet PUD Prophylaxis: Protonix DVT Prophylaxis:  Lovenox 40 mg daily  From a respiratory standpoint patient seems to be stable.  She remains on remdesivir and steroids.  Currently requiring only about 3 L of oxygen.  CRP is noted to be 7.3.  D-dimer 2.83.  Continue current treatment for now.  Incentive spirometry mobilization prone positioning as much as possible.  Actemra and baricitinib was discussed with the patient although we did not offer it at this time due to relative stability.  She is however amenable to taking baricitinib if her condition were to worsen.  The possible adverse side effects and contraindications were discussed with the patient.  The treatment plan and use of medications and known side effects were discussed with patient. Some of the medications used are based on case reports/anecdotal data.  All other medications being used in the management of COVID-19 based on limited study data.  Complete risks and long-term side effects are unknown, however in the best clinical judgment they seem to be of some benefit.  Patient wanted to proceed with treatment options provided.  Transaminitis Secondary to COVID-19.  Monitor LFTs periodically.  Leukopenia Secondary to COVID-19.  Essential hypertension Blood pressure noted to be borderline low.  Hold her lisinopril for now.  Obesity Estimated body mass index is  30.67 kg/m as calculated from the following:   Height as of this encounter: 5\' 6"  (1.676 m).   Weight as of this encounter: 86.2 kg.   DVT Prophylaxis: Lovenox Code Status: Full code Family Communication: Discussed with the patient.  Will update her husband later today. Disposition Plan: Hopefully return home when improved.  Mobilize as tolerated.  Status is:  Inpatient  Remains inpatient appropriate because:IV treatments appropriate due to intensity of illness or inability to take PO and Inpatient level of care appropriate due to severity of illness   Dispo: The patient is from: Home              Anticipated d/c is to: Home              Anticipated d/c date is: 3 days              Patient currently is not medically stable to d/c.      Medications:  Scheduled: . vitamin C  500 mg Oral Daily  . enoxaparin (LOVENOX) injection  40 mg Subcutaneous Q24H  . Ipratropium-Albuterol  1 puff Inhalation Q6H  . methylPREDNISolone (SOLU-MEDROL) injection  0.5 mg/kg Intravenous Q12H  . pantoprazole  40 mg Oral Daily  . zinc sulfate  220 mg Oral Daily   Continuous: . remdesivir 100 mg in NS 100 mL     ZOX:[id number], bisacodyl, chlorpheniramine-HYDROcodone, guaiFENesin-dextromethorphan, magnesium citrate, ondansetron **OR** ondansetron (ZOFRAN) IV, oxyCODONE, polyethylene glycol, zolpidem   Objective:  Vital Signs  Vitals:   06/04/20 0500 06/04/20 0600 06/04/20 0900 06/04/20 0938  BP: 107/66 115/76 122/81 122/81  Pulse: 68 72 86 86  Resp: 14 16  17   Temp: 98.4 F (36.9 C)   98.8 F (37.1 C)  TempSrc: Oral   Oral  SpO2: 91% 90% (!) 89% 93%  Weight:      Height:       No intake or output data in the 24 hours ending 06/04/20 1030 Filed Weights   06/03/20 1235  Weight: 86.2 kg    General appearance: Awake alert.  In no distress Resp: Noted to be tachypneic at rest.  No use of accessory muscles.  Crackles bilateral bases.  No wheezing or rhonchi. Cardio: S1-S2 is normal regular.  No S3-S4.  No rubs murmurs or bruit GI: Abdomen is soft.  Nontender nondistended.  Bowel sounds are present normal.  No masses organomegaly Extremities: No edema.  Full range of motion of lower extremities. Neurologic: Alert and oriented x3.  No focal neurological deficits.    Lab Results:  Data Reviewed: I have personally reviewed following labs and  imaging studies  CBC: Recent Labs  Lab 06/03/20 1343 06/04/20 0544  WBC 5.6 3.4*  NEUTROABS 4.5 2.7  HGB 13.0 12.1  HCT 38.6 36.7  MCV 93.7 94.1  PLT 168 147    Basic Metabolic Panel: Recent Labs  Lab 06/03/20 1343 06/04/20 0544  NA 136 139  K 3.9 4.2  CL 102 104  CO2 24 24  GLUCOSE 118* 145*  BUN 14 18  CREATININE 0.88 0.88  CALCIUM 9.1 9.3    GFR: Estimated Creatinine Clearance: 79 mL/min (by C-G formula based on SCr of 0.88 mg/dL).  Liver Function Tests: Recent Labs  Lab 06/03/20 1343 06/04/20 0544  AST 128* 107*  ALT 62* 61*  ALKPHOS 100 96  BILITOT 0.6 0.5  PROT 7.3 7.1  ALBUMIN 3.5 3.2*     Lipid Profile: Recent Labs    06/03/20 1343  TRIG 150*  Anemia Panel: Recent Labs    06/03/20 1343  FERRITIN 1,730*    Recent Results (from the past 240 hour(s))  Blood Culture (routine x 2)     Status: None (Preliminary result)   Collection Time: 06/03/20  1:43 PM   Specimen: Right Antecubital; Blood  Result Value Ref Range Status   Specimen Description   Final    RIGHT ANTECUBITAL Performed at [facility] [address], [place], [place] [address]    Special Requests   Final    BOTTLES DRAWN AEROBIC AND ANAEROBIC Blood Culture adequate volume Performed at [facility] [address] [place], [place] [address]    Culture   Final    NO GROWTH < 24 HOURS Performed at [facility] [address], [place], [place] [address]    Report Status PENDING  Incomplete  Blood Culture (routine x 2)     Status: None (Preliminary result)   Collection Time: 06/03/20  1:43 PM   Specimen: Left Antecubital; Blood  Result Value Ref Range Status   Specimen Description   Final    LEFT ANTECUBITAL Performed at [facility] [address], [place], [place] [address]    Special Requests   Final    BOTTLES DRAWN AEROBIC AND ANAEROBIC Blood Culture results may not be optimal due to an excessive  volume of blood received in culture bottles Performed at [facility] [address], [place], [place] [address]    Culture   Final    NO GROWTH < 24 HOURS Performed at [facility] [address] [place], [place] [address]    Report Status PENDING  Incomplete      Radiology Studies: DG Chest Port 1 View  Result Date: 06/03/2020 CLINICAL DATA:  COVID positive on Monday EXAM: PORTABLE CHEST 1 VIEW COMPARISON:  October 09, 2011, most recent comparison FINDINGS: Trachea is midline. Cardiomediastinal contours are normal accounting for low lung volumes, partially obscured by patchy bilateral airspace disease. Hilar structures are normal. Bilateral patchy and peripheral airspace opacities with basilar and mid chest predominance. No sign of pleural effusion. On limited assessment skeletal structures without acute process. IMPRESSION: Patchy bilateral airspace opacities compatible with multifocal pneumonia in the setting of COVID-19 infection. Electronically Signed   By: Zetta Bills M.D.   On: 06/03/2020 14:33       LOS: 1 day   [facility] Pager on [email or web address]  06/04/2020, 10:30 AM

## 2020-06-05 DIAGNOSIS — R110 Nausea: Secondary | ICD-10-CM

## 2020-06-05 LAB — COMPREHENSIVE METABOLIC PANEL
ALT: 61 U/L — ABNORMAL HIGH (ref 0–44)
AST: 89 U/L — ABNORMAL HIGH (ref 15–41)
Albumin: 3.2 g/dL — ABNORMAL LOW (ref 3.5–5.0)
Alkaline Phosphatase: 93 U/L (ref 38–126)
Anion gap: 11 (ref 5–15)
BUN: 33 mg/dL — ABNORMAL HIGH (ref 6–20)
CO2: 23 mmol/L (ref 22–32)
Calcium: 9.5 mg/dL (ref 8.9–10.3)
Chloride: 105 mmol/L (ref 98–111)
Creatinine, Ser: 0.95 mg/dL (ref 0.44–1.00)
GFR calc Af Amer: >60 mL/min (ref >60)
GFR calc non Af Amer: >60 mL/min (ref >60)
Glucose, Bld: 151 mg/dL — ABNORMAL HIGH (ref 70–99)
Potassium: 4.4 mmol/L (ref 3.5–5.1)
Sodium: 139 mmol/L (ref 135–145)
Total Bilirubin: 0.5 mg/dL (ref 0.3–1.2)
Total Protein: 6.7 g/dL (ref 6.5–8.1)

## 2020-06-05 LAB — CBC WITH DIFFERENTIAL/PLATELET
Abs Immature Granulocytes: 0.11 10*3/uL — ABNORMAL HIGH (ref 0.00–0.07)
Basophils Absolute: 0 10*3/uL (ref 0.0–0.1)
Basophils Relative: 0 %
Eosinophils Absolute: 0 10*3/uL (ref 0.0–0.5)
Eosinophils Relative: 0 %
HCT: 35.2 % — ABNORMAL LOW (ref 36.0–46.0)
Hemoglobin: 12 g/dL (ref 12.0–15.0)
Immature Granulocytes: 1 %
Lymphocytes Relative: 10 %
Lymphs Abs: 1.4 10*3/uL (ref 0.7–4.0)
MCH: 32.4 pg (ref 26.0–34.0)
MCHC: 34.1 g/dL (ref 30.0–36.0)
MCV: 95.1 fL (ref 80.0–100.0)
Monocytes Absolute: 0.7 10*3/uL (ref 0.1–1.0)
Monocytes Relative: 6 %
Neutro Abs: 10.8 10*3/uL — ABNORMAL HIGH (ref 1.7–7.7)
Neutrophils Relative %: 83 %
Platelets: 301 10*3/uL (ref 150–400)
RBC: 3.7 MIL/uL — ABNORMAL LOW (ref 3.87–5.11)
RDW: 13.6 % (ref 11.5–15.5)
WBC: 13 10*3/uL — ABNORMAL HIGH (ref 4.0–10.5)
nRBC: 0 % (ref 0.0–0.2)

## 2020-06-05 LAB — MAGNESIUM: Magnesium: 2.2 mg/dL (ref 1.7–2.4)

## 2020-06-05 LAB — C-REACTIVE PROTEIN: CRP: 2.2 mg/dL — ABNORMAL HIGH (ref <1.0)

## 2020-06-05 LAB — D-DIMER, QUANTITATIVE: D-Dimer, Quant: 1.63 ug/mL-FEU — ABNORMAL HIGH (ref 0.00–0.50)

## 2020-06-05 MED ORDER — ALUM & MAG HYDROXIDE-SIMETH 200-200-20 MG/5ML PO SUSP: 15.0000 mL | ORAL | Status: DC | PRN

## 2020-06-05 MED ORDER — BENZONATATE 100 MG PO CAPS
100.0000 mg | ORAL_CAPSULE | Freq: Three times a day (TID) | ORAL | Status: DC
  Administered 2020-06-05 – 2020-06-07 (×8): 100 mg via ORAL
  Filled 2020-06-05 (×8): qty 1

## 2020-06-05 NOTE — Progress Notes (Signed)
PROGRESS NOTE  Belinda Maxwell HYI:[id number] DOB: 1966/01/20 DOA: 06/03/2020  PCP: Caren Macadam, MD  Brief History/Interval Summary: 54 y.o. female with history of HTN and hyperlipidemia presented with progressive shortness of breath for 3 days. She tested positive for COVID-19 on May 29, 2020 at [facility].  Then she developed shortness of breath 3 days prior to admission.  She received monoclonal antibody infusion clinic on 8/20. However, dyspnea gotten worse.  She desaturated to 70% climbing stairs at home that prompted her to come to ED.  Chest x-ray with bilateral infiltrate.    Patient was hospitalized for further management.  Reason for Visit: Pneumonia due to COVID-19.  Acute respiratory failure with hypoxia  Consultants: None  Procedures: None  Antibiotics: Anti-infectives (From admission, onward)   Start     Dose/Rate Route Frequency Ordered Stop   06/04/20 1000  remdesivir 100 mg in sodium chloride 0.9 % 100 mL IVPB       "Followed by" Linked Group Details   100 mg 200 mL/hr over 30 Minutes Intravenous Daily 06/03/20 1600 06/08/20 0959   06/03/20 1800  remdesivir 200 mg in sodium chloride 0.9% 250 mL IVPB       "Followed by" Linked Group Details   200 mg 580 mL/hr over 30 Minutes Intravenous  Once 06/03/20 1600 06/03/20 1752      Subjective/Interval History: Patient continues to have shortness of breath with occasional coughing spells.  Also complains of some nausea overnight with the pain in the right lower abdomen.  Was able to tolerate her supper last night.  Denies any chest pain.      Assessment/Plan:  Acute Hypoxic Resp. Failure/Pneumonia due to COVID-19  Recent Labs  Lab 06/03/20 1343 06/04/20 0544 06/05/20 0500  DDIMER 2.13* 2.83* 1.63*  FERRITIN 1,730*  --   --   CRP 8.8* 7.3* 2.2*  ALT 62* 61* 61*  PROCALCITON <0.10  --   --     Objective findings: Fever: Remains afebrile Oxygen requirements: Nasal cannula 4 L/min.  Saturating  in the early 90s.    COVID 19 Therapeutics: Antibacterials: None.  Procalcitonin was less than 0.1 Remdesivir: Day 3 Steroids: Solu-Medrol twice daily Diuretics: No diuretics on a scheduled basis Actemra/Baricitinib: None yet PUD Prophylaxis: Protonix DVT Prophylaxis:  Lovenox 40 mg daily  From a respiratory standpoint patient remained stable.  She is on Remdesivir and steroids.  Currently requiring 4 L of oxygen saturating in the early 90s.  Inflammatory marker has improved.  D-dimer is also better.  Continue current treatment.  Incentive spirometry, mobilization and prone positioning as much as possible.  Can continue to hold off on Actemra or baricitinib.  The treatment plan and use of medications and known side effects were discussed with patient. Some of the medications used are based on case reports/anecdotal data.  All other medications being used in the management of COVID-19 based on limited study data.  Complete risks and long-term side effects are unknown, however in the best clinical judgment they seem to be of some benefit.  Patient wanted to proceed with treatment options provided.  Nausea with right lower abdominal pain Etiology for this is not entirely clear.  Patient complains of some discomfort in the right lower abdomen.  Not particularly tender in that area.  WBC is noted to be high today but could be due to steroids.  We will continue to observe her.  If her symptoms were to get worse may have to do a CT  scan.  Transaminitis Secondary to COVID-19.  Continue to monitor periodically.  Leukopenia Secondary to COVID-19.  Now leukocytosis due to steroids.  Essential hypertension Blood pressure noted to be borderline low.  Holding her lisinopril for now.  Obesity Estimated body mass index is 30.67 kg/m as calculated from the following:   Height as of this encounter: 5\' 6"  (1.676 m).   Weight as of this encounter: 86.2 kg.   DVT Prophylaxis: Lovenox Code Status: Full  code Family Communication: Discussed with the patient.  Husband was updated yesterday.  Will do so again today. Disposition Plan: Hopefully return home when improved.  Mobilize as tolerated.  Status is: Inpatient  Remains inpatient appropriate because:IV treatments appropriate due to intensity of illness or inability to take PO and Inpatient level of care appropriate due to severity of illness   Dispo:  Patient From: Home  Planned Disposition: Home  Expected discharge date: 06/07/20  Medically stable for discharge: No      Medications:  Scheduled: . vitamin C  500 mg Oral Daily  . benzonatate  100 mg Oral TID  . enoxaparin (LOVENOX) injection  40 mg Subcutaneous Q24H  . Ipratropium-Albuterol  1 puff Inhalation Q6H  . methylPREDNISolone (SOLU-MEDROL) injection  0.5 mg/kg Intravenous Q12H  . pantoprazole  40 mg Oral Daily  . zinc sulfate  220 mg Oral Daily   Continuous: . remdesivir 100 mg in NS 100 mL Stopped (06/04/20 1132)   MWU:[id number], bisacodyl, chlorpheniramine-HYDROcodone, guaiFENesin-dextromethorphan, magnesium citrate, ondansetron **OR** ondansetron (ZOFRAN) IV, oxyCODONE, polyethylene glycol, zolpidem   Objective:  Vital Signs  Vitals:   06/05/20 0315 06/05/20 0530 06/05/20 0700 06/05/20 0900  BP: 120/76 107/71 117/69 117/69  Pulse: 70 81 72 72  Resp: 19 17 16 16   Temp:      TempSrc:      SpO2: 91% 94% 91% 91%  Weight:      Height:        Intake/Output Summary (Last 24 hours) at 06/05/2020 1023 Last data filed at 06/04/2020 1132 Gross per 24 hour  Intake 100 ml  Output --  Net 100 ml   Filed Weights   06/03/20 1235  Weight: 86.2 kg    General appearance: Awake alert.  In no distress Resp: Mildly tachypneic.  No use of accessory muscles.  Coarse breath sounds with crackles at the bases.  No wheezing or rhonchi  Cardio: S1-S2 is normal regular.  No S3-S4.  No rubs murmurs or bruit GI: Abdomen is soft.  No definite tenderness appreciated in  the right lower quadrant.  No masses or organomegaly.  Bowel sounds present.   Extremities: No edema.  Full range of motion of lower extremities. Neurologic: Alert and oriented x3.  No focal neurological deficits.    Lab Results:  Data Reviewed: I have personally reviewed following labs and imaging studies  CBC: Recent Labs  Lab 06/03/20 1343 06/04/20 0544 06/05/20 0500  WBC 5.6 3.4* 13.0*  NEUTROABS 4.5 2.7 10.8*  HGB 13.0 12.1 12.0  HCT 38.6 36.7 35.2*  MCV 93.7 94.1 95.1  PLT 168 196 664    Basic Metabolic Panel: Recent Labs  Lab 06/03/20 1343 06/04/20 0544 06/05/20 0500  NA 136 139 139  K 3.9 4.2 4.4  CL 102 104 105  CO2 24 24 23   GLUCOSE 118* 145* 151*  BUN 14 18 33*  CREATININE 0.88 0.88 0.95  CALCIUM 9.1 9.3 9.5  MG  --   --  2.2    GFR: Estimated Creatinine  Clearance: 73.2 mL/min (by C-G formula based on SCr of 0.95 mg/dL).  Liver Function Tests: Recent Labs  Lab 06/03/20 1343 06/04/20 0544 06/05/20 0500  AST 128* 107* 89*  ALT 62* 61* 61*  ALKPHOS 100 96 93  BILITOT 0.6 0.5 0.5  PROT 7.3 7.1 6.7  ALBUMIN 3.5 3.2* 3.2*     Lipid Profile: Recent Labs    06/03/20 1343  TRIG 150*    Anemia Panel: Recent Labs    06/03/20 1343  FERRITIN 1,730*    Recent Results (from the past 240 hour(s))  Blood Culture (routine x 2)     Status: None (Preliminary result)   Collection Time: 06/03/20  1:43 PM   Specimen: Right Antecubital; Blood  Result Value Ref Range Status   Specimen Description   Final    RIGHT ANTECUBITAL Performed at [facility], [place] [address], [place], [place] [address]    Special Requests   Final    BOTTLES DRAWN AEROBIC AND ANAEROBIC Blood Culture adequate volume Performed at [facility] [address], [place], [place] [address]    Culture   Final    NO GROWTH 2 DAYS Performed at [facility] [address], [place], [place] [address]    Report Status PENDING   Incomplete  Blood Culture (routine x 2)     Status: None (Preliminary result)   Collection Time: 06/03/20  1:43 PM   Specimen: Left Antecubital; Blood  Result Value Ref Range Status   Specimen Description   Final    LEFT ANTECUBITAL Performed at [facility] [address], [place], [place] [address]    Special Requests   Final    BOTTLES DRAWN AEROBIC AND ANAEROBIC Blood Culture results may not be optimal due to an excessive volume of blood received in culture bottles Performed at [facility] [address], [place], [place] [address]    Culture   Final    NO GROWTH 2 DAYS Performed at [facility] [address], [place], [place] [address]    Report Status PENDING  Incomplete      Radiology Studies: DG Chest Port 1 View  Result Date: 06/03/2020 CLINICAL DATA:  COVID positive on Monday EXAM: PORTABLE CHEST 1 VIEW COMPARISON:  October 09, 2011, most recent comparison FINDINGS: Trachea is midline. Cardiomediastinal contours are normal accounting for low lung volumes, partially obscured by patchy bilateral airspace disease. Hilar structures are normal. Bilateral patchy and peripheral airspace opacities with basilar and mid chest predominance. No sign of pleural effusion. On limited assessment skeletal structures without acute process. IMPRESSION: Patchy bilateral airspace opacities compatible with multifocal pneumonia in the setting of COVID-19 infection. Electronically Signed   By: Zetta Bills M.D.   On: 06/03/2020 14:33       LOS: 2 days   [facility] Pager on [email or web address]  06/05/2020, 10:23 AM

## 2020-06-06 LAB — C-REACTIVE PROTEIN: CRP: 3 mg/dL — ABNORMAL HIGH (ref <1.0)

## 2020-06-06 LAB — CBC WITH DIFFERENTIAL/PLATELET
Abs Immature Granulocytes: 0.25 10*3/uL — ABNORMAL HIGH (ref 0.00–0.07)
Basophils Absolute: 0 10*3/uL (ref 0.0–0.1)
Basophils Relative: 0 %
Eosinophils Absolute: 0 10*3/uL (ref 0.0–0.5)
Eosinophils Relative: 0 %
HCT: 34.4 % — ABNORMAL LOW (ref 36.0–46.0)
Hemoglobin: 11.5 g/dL — ABNORMAL LOW (ref 12.0–15.0)
Immature Granulocytes: 2 %
Lymphocytes Relative: 11 %
Lymphs Abs: 1.5 10*3/uL (ref 0.7–4.0)
MCH: 31.8 pg (ref 26.0–34.0)
MCHC: 33.4 g/dL (ref 30.0–36.0)
MCV: 95 fL (ref 80.0–100.0)
Monocytes Absolute: 1.2 10*3/uL — ABNORMAL HIGH (ref 0.1–1.0)
Monocytes Relative: 9 %
Neutro Abs: 10.4 10*3/uL — ABNORMAL HIGH (ref 1.7–7.7)
Neutrophils Relative %: 78 %
Platelets: 317 10*3/uL (ref 150–400)
RBC: 3.62 MIL/uL — ABNORMAL LOW (ref 3.87–5.11)
RDW: 13.2 % (ref 11.5–15.5)
WBC: 13.3 10*3/uL — ABNORMAL HIGH (ref 4.0–10.5)
nRBC: 0 % (ref 0.0–0.2)

## 2020-06-06 LAB — COMPREHENSIVE METABOLIC PANEL
ALT: 80 U/L — ABNORMAL HIGH (ref 0–44)
AST: 83 U/L — ABNORMAL HIGH (ref 15–41)
Albumin: 3 g/dL — ABNORMAL LOW (ref 3.5–5.0)
Alkaline Phosphatase: 80 U/L (ref 38–126)
Anion gap: 11 (ref 5–15)
BUN: 31 mg/dL — ABNORMAL HIGH (ref 6–20)
CO2: 23 mmol/L (ref 22–32)
Calcium: 9.1 mg/dL (ref 8.9–10.3)
Chloride: 105 mmol/L (ref 98–111)
Creatinine, Ser: 0.9 mg/dL (ref 0.44–1.00)
GFR calc Af Amer: >60 mL/min (ref >60)
GFR calc non Af Amer: >60 mL/min (ref >60)
Glucose, Bld: 124 mg/dL — ABNORMAL HIGH (ref 70–99)
Potassium: 4.3 mmol/L (ref 3.5–5.1)
Sodium: 139 mmol/L (ref 135–145)
Total Bilirubin: 0.6 mg/dL (ref 0.3–1.2)
Total Protein: 6.1 g/dL — ABNORMAL LOW (ref 6.5–8.1)

## 2020-06-06 LAB — D-DIMER, QUANTITATIVE: D-Dimer, Quant: 1.24 ug/mL-FEU — ABNORMAL HIGH (ref 0.00–0.50)

## 2020-06-06 NOTE — Progress Notes (Signed)
PROGRESS NOTE  Belinda Maxwell:[id number] DOB: 12/12/1965 DOA: 06/03/2020  PCP: Caren Macadam, MD  Brief History/Interval Summary: 54 y.o. female with history of HTN and hyperlipidemia presented with progressive shortness of breath for 3 days. She tested positive for COVID-19 on May 29, 2020 at [facility].  Then she developed shortness of breath 3 days prior to admission.  She received monoclonal antibody infusion clinic on 8/20. However, dyspnea gotten worse.  She desaturated to 70% climbing stairs at home that prompted her to come to ED.  Chest x-ray with bilateral infiltrate.    Patient was hospitalized for further management.  Reason for Visit: Pneumonia due to COVID-19.  Acute respiratory failure with hypoxia  Consultants: None  Procedures: None  Antibiotics: Anti-infectives (From admission, onward)   Start     Dose/Rate Route Frequency Ordered Stop   06/04/20 1000  remdesivir 100 mg in sodium chloride 0.9 % 100 mL IVPB       "Followed by" Linked Group Details   100 mg 200 mL/hr over 30 Minutes Intravenous Daily 06/03/20 1600 06/08/20 0959   06/03/20 1800  remdesivir 200 mg in sodium chloride 0.9% 250 mL IVPB       "Followed by" Linked Group Details   200 mg 580 mL/hr over 30 Minutes Intravenous  Once 06/03/20 1600 06/03/20 1752      Subjective/Interval History: Patient continues to have coughing spells.  Dry cough.  Denies chest pain.  Shortness of breath mainly with exertion.  Abdominal pain appears to have subsided.  No further episodes of nausea or vomiting.     Assessment/Plan:  Acute Hypoxic Resp. Failure/Pneumonia due to COVID-19  Recent Labs  Lab 06/03/20 1343 06/04/20 0544 06/05/20 0500 06/06/20 0434  DDIMER 2.13* 2.83* 1.63* 1.24*  FERRITIN 1,730*  --   --   --   CRP 8.8* 7.3* 2.2* 3.0*  ALT 62* 61* 61* 80*  PROCALCITON <0.10  --   --   --     Objective findings: Fever: Remains afebrile Oxygen requirements: Nasal cannula 4 L/min  saturating in the mid 90s.  Dropped down to 3 L.    COVID 19 Therapeutics: Antibacterials: None.  Procalcitonin was less than 0.1 Remdesivir: Day 4 Steroids: Solu-Medrol twice daily Diuretics: No diuretics on a scheduled basis Actemra/Baricitinib: None yet PUD Prophylaxis: Protonix DVT Prophylaxis:  Lovenox 40 mg daily  From a respiratory standpoint patient remained stable.  Seems to be slowly improving.  Continues to have cough.  Continue antitussive agents.  Remains on Remdesivir and steroids.  Inflammatory markers have been improving.  CRP noted to be slightly higher today.  D-dimer is better.  Continue to mobilize, incentive spirometry, out of bed to chair.  Continue to wean down oxygen.  The treatment plan and use of medications and known side effects were discussed with patient. Some of the medications used are based on case reports/anecdotal data.  All other medications being used in the management of COVID-19 based on limited study data.  Complete risks and long-term side effects are unknown, however in the best clinical judgment they seem to be of some benefit.  Patient wanted to proceed with treatment options provided.  Nausea with right lower abdominal pain Etiology unclear but appears to have improved.  Continue to monitor closely.  Can hold off on imaging studies for now.  Transaminitis Secondary to COVID-19.  Continue to monitor periodically.  Leukopenia Secondary to COVID-19.  Now leukocytosis likely due to steroids.  Essential hypertension Lisinopril was  placed on hold due to borderline low blood pressures.  Blood pressures have been stable in the last 24 hours.    Obesity Estimated body mass index is 30.67 kg/m as calculated from the following:   Height as of this encounter: 5\' 6"  (1.676 m).   Weight as of this encounter: 86.2 kg.   DVT Prophylaxis: Lovenox Code Status: Full code Family Communication: Discussed with the patient.  Unable to reach husband this  morning.  We will try again later today.   Disposition Plan: Hopefully return home when improved.  Mobilize as tolerated.  Status is: Inpatient  Remains inpatient appropriate because:IV treatments appropriate due to intensity of illness or inability to take PO and Inpatient level of care appropriate due to severity of illness   Dispo:  Patient From: Home  Planned Disposition: Home  Expected discharge date: 06/07/20  Medically stable for discharge: No      Medications:  Scheduled:  vitamin C  500 mg Oral Daily   benzonatate  100 mg Oral TID   enoxaparin (LOVENOX) injection  40 mg Subcutaneous Q24H   Ipratropium-Albuterol  1 puff Inhalation Q6H   methylPREDNISolone (SOLU-MEDROL) injection  0.5 mg/kg Intravenous Q12H   pantoprazole  40 mg Oral Daily   zinc sulfate  220 mg Oral Daily   Continuous:  remdesivir 100 mg in NS 100 mL 100 mg (06/06/20 1000)   QJF:[id number], alum & mag hydroxide-simeth, bisacodyl, chlorpheniramine-HYDROcodone, guaiFENesin-dextromethorphan, magnesium citrate, ondansetron **OR** ondansetron (ZOFRAN) IV, oxyCODONE, polyethylene glycol, zolpidem   Objective:  Vital Signs  Vitals:   06/05/20 2145 06/06/20 0149 06/06/20 0546 06/06/20 0611  BP: (!) 148/84 128/81 128/74   Pulse: 82 79 67   Resp: 18 18 18    Temp: 98.1 F (36.7 C) 98.3 F (36.8 C) 98.7 F (37.1 C)   TempSrc: Oral Oral Oral   SpO2: 93% 95% 96% 90%  Weight:      Height:       No intake or output data in the 24 hours ending 06/06/20 1342 Filed Weights   06/03/20 1235  Weight: 86.2 kg    General appearance: Awake alert.  In no distress Resp: Mildly tachypneic at rest.  Coarse breath sound bilaterally with crackles at the bases.  No wheezing or rhonchi.   Cardio: S1-S2 is normal regular.  No S3-S4.  No rubs murmurs or bruit GI: Abdomen soft today.  Nontender.  Bowel sounds present normal.  No masses organomegaly.   Extremities: No edema.  Full range of motion of lower  extremities. Neurologic: Alert and oriented x3.  No focal neurological deficits.     Lab Results:  Data Reviewed: I have personally reviewed following labs and imaging studies  CBC: Recent Labs  Lab 06/03/20 1343 06/04/20 0544 06/05/20 0500 06/06/20 0434  WBC 5.6 3.4* 13.0* 13.3*  NEUTROABS 4.5 2.7 10.8* 10.4*  HGB 13.0 12.1 12.0 11.5*  HCT 38.6 36.7 35.2* 34.4*  MCV 93.7 94.1 95.1 95.0  PLT 168 196 301 342    Basic Metabolic Panel: Recent Labs  Lab 06/03/20 1343 06/04/20 0544 06/05/20 0500 06/06/20 0434  NA 136 139 139 139  K 3.9 4.2 4.4 4.3  CL 102 104 105 105  CO2 24 24 23 23   GLUCOSE 118* 145* 151* 124*  BUN 14 18 33* 31*  CREATININE 0.88 0.88 0.95 0.90  CALCIUM 9.1 9.3 9.5 9.1  MG  --   --  2.2  --     GFR: Estimated Creatinine Clearance: 77.2 mL/min (by  C-G formula based on SCr of 0.9 mg/dL).  Liver Function Tests: Recent Labs  Lab 06/03/20 1343 06/04/20 0544 06/05/20 0500 06/06/20 0434  AST 128* 107* 89* 83*  ALT 62* 61* 61* 80*  ALKPHOS 100 96 93 80  BILITOT 0.6 0.5 0.5 0.6  PROT 7.3 7.1 6.7 6.1*  ALBUMIN 3.5 3.2* 3.2* 3.0*     Lipid Profile: Recent Labs    06/03/20 1343  TRIG 150*    Anemia Panel: Recent Labs    06/03/20 1343  FERRITIN 1,730*    Recent Results (from the past 240 hour(s))  Blood Culture (routine x 2)     Status: None (Preliminary result)   Collection Time: 06/03/20  1:43 PM   Specimen: Right Antecubital; Blood  Result Value Ref Range Status   Specimen Description   Final    RIGHT ANTECUBITAL Performed at [facility], [place] [address] [place], [place] [address]    Special Requests   Final    BOTTLES DRAWN AEROBIC AND ANAEROBIC Blood Culture adequate volume Performed at [facility] [address], [place], [place] [address]    Culture   Final    NO GROWTH 3 DAYS Performed at [facility], [place] [address], [place], [place] [address]    Report Status PENDING   Incomplete  Blood Culture (routine x 2)     Status: None (Preliminary result)   Collection Time: 06/03/20  1:43 PM   Specimen: Left Antecubital; Blood  Result Value Ref Range Status   Specimen Description   Final    LEFT ANTECUBITAL Performed at [facility] [address], [place], [place] [address]    Special Requests   Final    BOTTLES DRAWN AEROBIC AND ANAEROBIC Blood Culture results may not be optimal due to an excessive volume of blood received in culture bottles Performed at [facility] [address], [place], [place] [address]    Culture   Final    NO GROWTH 3 DAYS Performed at [facility], [place] [address], [place], [place] [address]    Report Status PENDING  Incomplete      Radiology Studies: No results found.     LOS: 3 days   Belinda Maxwell [employer] on [email or web address]  06/06/2020, 1:42 PM

## 2020-06-07 LAB — COMPREHENSIVE METABOLIC PANEL
ALT: 78 U/L — ABNORMAL HIGH (ref 0–44)
AST: 55 U/L — ABNORMAL HIGH (ref 15–41)
Albumin: 2.9 g/dL — ABNORMAL LOW (ref 3.5–5.0)
Alkaline Phosphatase: 76 U/L (ref 38–126)
Anion gap: 8 (ref 5–15)
BUN: 24 mg/dL — ABNORMAL HIGH (ref 6–20)
CO2: 23 mmol/L (ref 22–32)
Calcium: 8.8 mg/dL — ABNORMAL LOW (ref 8.9–10.3)
Chloride: 107 mmol/L (ref 98–111)
Creatinine, Ser: 0.74 mg/dL (ref 0.44–1.00)
GFR calc Af Amer: >60 mL/min (ref >60)
GFR calc non Af Amer: >60 mL/min (ref >60)
Glucose, Bld: 168 mg/dL — ABNORMAL HIGH (ref 70–99)
Potassium: 4.5 mmol/L (ref 3.5–5.1)
Sodium: 138 mmol/L (ref 135–145)
Total Bilirubin: 0.5 mg/dL (ref 0.3–1.2)
Total Protein: 5.8 g/dL — ABNORMAL LOW (ref 6.5–8.1)

## 2020-06-07 LAB — CBC WITH DIFFERENTIAL/PLATELET
Abs Immature Granulocytes: 0.35 10*3/uL — ABNORMAL HIGH (ref 0.00–0.07)
Basophils Absolute: 0 10*3/uL (ref 0.0–0.1)
Basophils Relative: 0 %
Eosinophils Absolute: 0 10*3/uL (ref 0.0–0.5)
Eosinophils Relative: 0 %
HCT: 34.3 % — ABNORMAL LOW (ref 36.0–46.0)
Hemoglobin: 11.3 g/dL — ABNORMAL LOW (ref 12.0–15.0)
Immature Granulocytes: 4 %
Lymphocytes Relative: 10 %
Lymphs Abs: 1 10*3/uL (ref 0.7–4.0)
MCH: 31.4 pg (ref 26.0–34.0)
MCHC: 32.9 g/dL (ref 30.0–36.0)
MCV: 95.3 fL (ref 80.0–100.0)
Monocytes Absolute: 0.7 10*3/uL (ref 0.1–1.0)
Monocytes Relative: 8 %
Neutro Abs: 7.3 10*3/uL (ref 1.7–7.7)
Neutrophils Relative %: 78 %
Platelets: 291 10*3/uL (ref 150–400)
RBC: 3.6 MIL/uL — ABNORMAL LOW (ref 3.87–5.11)
RDW: 12.9 % (ref 11.5–15.5)
WBC: 9.3 10*3/uL (ref 4.0–10.5)
nRBC: 0 % (ref 0.0–0.2)

## 2020-06-07 LAB — D-DIMER, QUANTITATIVE: D-Dimer, Quant: 0.86 ug/mL-FEU — ABNORMAL HIGH (ref 0.00–0.50)

## 2020-06-07 LAB — C-REACTIVE PROTEIN: CRP: 1.4 mg/dL — ABNORMAL HIGH (ref <1.0)

## 2020-06-07 MED ORDER — PREDNISONE 10 MG PO TABS: ORAL_TABLET | ORAL | 0 refills | Status: DC

## 2020-06-07 MED ORDER — PANTOPRAZOLE SODIUM 40 MG PO TBEC: 40.0000 mg | DELAYED_RELEASE_TABLET | Freq: Every day | ORAL | 0 refills | Status: DC

## 2020-06-07 NOTE — Discharge Summary (Signed)
Physician Discharge Summary  Belinda Maxwell WUJ:[id number] DOB: [birth year] DOA: 06/03/2020  PCP: Caren Macadam, MD  Admit date: 06/03/2020 Discharge date: 06/07/2020  Admitted From: Home Discharge disposition: Home with home oxygen   Code Status: Full Code  Diet Recommendation: Cardiac diet  Discharge Diagnosis:   Active Problems:   Acute hypoxemic respiratory failure due to COVID-19 [facility])  History of Present Illness / Brief narrative:  Patient is a 54 y.o.femalewith history ofHTN, hyperlipidemia who presented to ED on 8/21 with progressive shortness of breath for 3 days. 8/16, she tested positive for COVID-19 at [place].  8/20, she received monoclonal antibody at infusion clinic.  Symptoms worsened when she came to the ED.  In the ED, chest x-ray showed patchy bilateral airspace opacities compatible with multifocal pneumonia. Patient was admitted and treated for [facility] pneumonia  Hospital Course:  COVID pneumonia Acute respiratory failure with hypoxia  -Presented with worsening shortness of breath -COVID test: PCR positive as an outpatient on 8/16 -Chest imaging: Bilateral airspace opacities -Treatment: Patient completed 5 days of IV remdesivir today on 8/25.  She is currently on IV Solu-Medrol 40 mg twice daily.  We will switch to oral prednisone and taper to be stopped in next 7 to 10 days. Continue PPI while on steroids. -Continue cough medicines as needed. -Currently patient is on 3 L oxygen at rest. On ambulation, she tends to require more. -Continue airborne/contact isolation precautions for 3 weeks since diagnosis. -WBC, lactic acid, procalcitonin and inflammatory markers trend as below showing overall improvement.  Recent Labs  Lab 06/03/20 1343 06/03/20 1516 06/04/20 0544 06/05/20 0500 06/06/20 0434 06/07/20 0326  WBC 5.6  --  3.4* 13.0* 13.3* 9.3  LATICACIDVEN 1.2 1.2  --   --   --   --   PROCALCITON <0.10  --   --   --   --   --   DDIMER  2.13*  --  2.83* 1.63* 1.24* 0.86*  FERRITIN 1,730*  --   --   --   --   --   LDH 592*  --   --   --   --   --   CRP 8.8*  --  7.3* 2.2* 3.0* 1.4*  ALT 62*  --  61* 61* 80* 78*   The treatment plan and use of medications and known side effects were discussed with patient/family. Some of the medications used are based on case reports/anecdotal data.  All other medications being used in the management of COVID-19 based on limited study data.  Complete risks and long-term side effects are unknown, however in the best clinical judgment they seem to be of some benefit.  Patient wanted to proceed with treatment options provided.  Transaminitis Secondary to COVID-19.  Improving. Continue to monitor periodically as an outpatient. Recent Labs  Lab 06/03/20 1343 06/04/20 0544 06/05/20 0500 06/06/20 0434 06/07/20 0326  AST 128* 107* 89* 83* 55*  ALT 62* 61* 61* 80* 78*  ALKPHOS 100 96 93 80 76  BILITOT 0.6 0.5 0.5 0.6 0.5  PROT 7.3 7.1 6.7 6.1* 5.8*  ALBUMIN 3.5 3.2* 3.2* 3.0* 2.9*   Essential hypertension On lisinopril 10 mg daily at home. Resumed at discharge.    Patient is still requiring supplemental oxygen and may continue to require it for next several days.  She otherwise feels better.  Her inflammatory markers are improving as well.  Okay to discharge home today..   Subjective:  Seen and examined this morning. Pleasant,  middle-aged Caucasian female.  Sitting up in chair.  Not in distress.   Discharge Exam:   Vitals:   06/06/20 1425 06/06/20 1941 06/07/20 0354 06/07/20 1400  BP: 118/82 133/75 136/80 130/81  Pulse: 76 72 67 75  Resp: 16 15 18 18   Temp: 97.7 F (36.5 C) 97.9 F (36.6 C) 98.2 F (36.8 C) 98.4 F (36.9 C)  TempSrc: Oral Oral Oral Oral  SpO2: 95% 95% 92% 97%  Weight:      Height:        Body mass index is 30.67 kg/m.  General exam: Appears calm and comfortable.  Not in physical distress. Skin: No rashes, lesions or ulcers. HEENT: Atraumatic,  normocephalic, supple neck, no obvious bleeding Lungs: Clear to auscultation bilaterally CVS: Regular rate and rhythm, no murmur GI/Abd soft, nontender, nondistended, bowel sound present CNS: Alert, awake, oriented x3 Psychiatry: Mood appropriate Extremities: No pedal edema, no calf tenderness  Follow ups:   Discharge Instructions    Diet - low sodium heart healthy   Complete by: As directed    Increase activity slowly   Complete by: As directed       Follow-up Information    Caren Macadam, MD Follow up.   Specialty: Family Medicine Contact information: [place] [address] [phone]               Recommendations for Outpatient Follow-Up:   1. Follow-up with PCP as an outpatient  Discharge Instructions:  Follow with Primary MD Caren Macadam, MD in 7 days   Get CBC/BMP checked in next visit within 1 week by PCP or SNF MD ( we routinely change or add medications that can affect your baseline labs and fluid status, therefore we recommend that you get the mentioned basic workup next visit with your PCP, your PCP may decide not to get them or add new tests based on their clinical decision)  On your next visit with your PCP, please Get Medicines reviewed and adjusted.  Please request your PCP  to go over all Hospital Tests and Procedure/Radiological results at the follow up, please get all Hospital records sent to your Prim MD by signing hospital release before you go home.  Activity: As tolerated with Full fall precautions use walker/cane & assistance as needed  For Heart failure patients - Check your Weight same time everyday, if you gain over 2 pounds, or you develop in leg swelling, experience more shortness of breath or chest pain, call your Primary MD immediately. Follow Cardiac Low Salt Diet and 1.5 lit/day fluid restriction.  If you have smoked or chewed Tobacco in the last 2 yrs please stop smoking, stop any regular Alcohol   and or any Recreational drug use.  If you experience worsening of your admission symptoms, develop shortness of breath, life threatening emergency, suicidal or homicidal thoughts you must seek medical attention immediately by calling 911 or calling your MD immediately  if symptoms less severe.  You Must read complete instructions/literature along with all the possible adverse reactions/side effects for all the Medicines you take and that have been prescribed to you. Take any new Medicines after you have completely understood and accpet all the possible adverse reactions/side effects.   Do not drive, operate heavy machinery, perform activities at heights, swimming or participation in water activities or provide baby sitting services if your were admitted for syncope or siezures until you have seen by Primary MD or a Neurologist and advised to do so  again.  Do not drive when taking Pain medications.  Do not take more than prescribed Pain, Sleep and Anxiety Medications  Wear Seat belts while driving.   Please note You were cared for by a hospitalist during your hospital stay. If you have any questions about your discharge medications or the care you received while you were in the hospital after you are discharged, you can call the unit and asked to speak with the hospitalist on call if the hospitalist that took care of you is not available. Once you are discharged, your primary care physician will handle any further medical issues. Please note that NO REFILLS for any discharge medications will be authorized once you are discharged, as it is imperative that you return to your primary care physician (or establish a relationship with a primary care physician if you do not have one) for your aftercare needs so that they can reassess your need for medications and monitor your lab values.    Allergies as of 06/07/2020      Reactions   Sulfamethoxazole Hives      Medication List    TAKE these  medications   acyclovir 400 MG tablet Commonly known as: ZOVIRAX Take 1 tablet (400 mg total) by mouth 5 (five) times daily. At onset of rash What changed:   when to take this  reasons to take this  additional instructions   benzonatate 100 MG capsule Commonly known as: Tessalon Perles Take 1 capsule (100 mg total) by mouth 3 (three) times daily as needed.   DAYQUIL MULTI-SYMPTOM COLD/FLU PO Take 1 tablet by mouth 2 (two) times daily as needed (cold symptoms).   ibuprofen 200 MG tablet Commonly known as: ADVIL Take 200 mg by mouth every 6 (six) hours as needed.   lisinopril 10 MG tablet Commonly known as: ZESTRIL TAKE 1 TABLET BY MOUTH EVERY DAY   meloxicam 15 MG tablet Commonly known as: MOBIC TAKE 1 TABLET (15 MG TOTAL) BY MOUTH DAILY AS NEEDED FOR PAIN.   NYQUIL COLD & FLU PO Take 1 tablet by mouth 2 (two) times daily as needed (cold symptoms).   pantoprazole 40 MG tablet Commonly known as: PROTONIX Take 1 tablet (40 mg total) by mouth daily for 10 days. Start taking on: June 08, 2020   predniSONE 10 MG tablet Commonly known as: DELTASONE Take 4 tabs twice a day for 2 days, then take 4 tabs daily for 2 days, then take 3 tabs daily for 2 days, then take 2 tabs daily for 2 days, then take 1 tab daily for 2 days, then STOP.   SUMAtriptan 100 MG tablet Commonly known as: IMITREX TAKE 1 TABLET BY MOUTH TWICE A DAY AS NEEDED What changed: reasons to take this            Durable Medical Equipment  (From admission, onward)         Start     Ordered   06/07/20 1443  DME Oxygen  Once       Question Answer Comment  Length of Need 6 Months   Mode or (Route) Nasal cannula   Liters per Minute 3   Frequency Continuous (stationary and portable oxygen unit needed)   Oxygen conserving device Yes   Oxygen delivery system Gas      06/07/20 1442          Time coordinating discharge: 35 minutes  The results of significant diagnostics from this  hospitalization (including imaging, microbiology, ancillary and laboratory) are  listed below for reference.    Procedures and Diagnostic Studies:   DG Chest Port 1 View  Result Date: 06/03/2020 CLINICAL DATA:  COVID positive on Monday EXAM: PORTABLE CHEST 1 VIEW COMPARISON:  October 09, 2011, most recent comparison FINDINGS: Trachea is midline. Cardiomediastinal contours are normal accounting for low lung volumes, partially obscured by patchy bilateral airspace disease. Hilar structures are normal. Bilateral patchy and peripheral airspace opacities with basilar and mid chest predominance. No sign of pleural effusion. On limited assessment skeletal structures without acute process. IMPRESSION: Patchy bilateral airspace opacities compatible with multifocal pneumonia in the setting of COVID-19 infection. Electronically Signed   By: Zetta Bills M.D.   On: 06/03/2020 14:33     Labs:   Basic Metabolic Panel: Recent Labs  Lab 06/03/20 1343 06/03/20 1343 06/04/20 0544 06/04/20 0544 06/05/20 0500 06/05/20 0500 06/06/20 0434 06/07/20 0326  NA 136  --  139  --  139  --  139 138  K 3.9   < > 4.2   < > 4.4   < > 4.3 4.5  CL 102  --  104  --  105  --  105 107  CO2 24  --  24  --  23  --  23 23  GLUCOSE 118*  --  145*  --  151*  --  124* 168*  BUN 14  --  18  --  33*  --  31* 24*  CREATININE 0.88  --  0.88  --  0.95  --  0.90 0.74  CALCIUM 9.1  --  9.3  --  9.5  --  9.1 8.8*  MG  --   --   --   --  2.2  --   --   --    < > = values in this interval not displayed.   GFR Estimated Creatinine Clearance: 86.9 mL/min (by C-G formula based on SCr of 0.74 mg/dL). Liver Function Tests: Recent Labs  Lab 06/03/20 1343 06/04/20 0544 06/05/20 0500 06/06/20 0434 06/07/20 0326  AST 128* 107* 89* 83* 55*  ALT 62* 61* 61* 80* 78*  ALKPHOS 100 96 93 80 76  BILITOT 0.6 0.5 0.5 0.6 0.5  PROT 7.3 7.1 6.7 6.1* 5.8*  ALBUMIN 3.5 3.2* 3.2* 3.0* 2.9*   No results for input(s): LIPASE, AMYLASE in the  last 168 hours. No results for input(s): AMMONIA in the last 168 hours. Coagulation profile No results for input(s): INR, PROTIME in the last 168 hours.  CBC: Recent Labs  Lab 06/03/20 1343 06/04/20 0544 06/05/20 0500 06/06/20 0434 06/07/20 0326  WBC 5.6 3.4* 13.0* 13.3* 9.3  NEUTROABS 4.5 2.7 10.8* 10.4* 7.3  HGB 13.0 12.1 12.0 11.5* 11.3*  HCT 38.6 36.7 35.2* 34.4* 34.3*  MCV 93.7 94.1 95.1 95.0 95.3  PLT 168 196 301 317 291   Cardiac Enzymes: No results for input(s): CKTOTAL, CKMB, CKMBINDEX, TROPONINI in the last 168 hours. BNP: Invalid input(s): POCBNP CBG: No results for input(s): GLUCAP in the last 168 hours. D-Dimer Recent Labs    06/06/20 0434 06/07/20 0326  DDIMER 1.24* 0.86*   Hgb A1c No results for input(s): HGBA1C in the last 72 hours. Lipid Profile No results for input(s): CHOL, HDL, LDLCALC, TRIG, CHOLHDL, LDLDIRECT in the last 72 hours. Thyroid function studies No results for input(s): TSH, T4TOTAL, T3FREE, THYROIDAB in the last 72 hours.  Invalid input(s): FREET3 Anemia work up No results for input(s): VITAMINB12, FOLATE, FERRITIN, TIBC, IRON, RETICCTPCT in the last  72 hours. Microbiology Recent Results (from the past 240 hour(s))  Blood Culture (routine x 2)     Status: None (Preliminary result)   Collection Time: 06/03/20  1:43 PM   Specimen: Right Antecubital; Blood  Result Value Ref Range Status   Specimen Description   Final    RIGHT ANTECUBITAL Performed at [facility], [place] [address] [place], [place] [address]    Special Requests   Final    BOTTLES DRAWN AEROBIC AND ANAEROBIC Blood Culture adequate volume Performed at [facility] [address], [place], [place] [address]    Culture   Final    NO GROWTH 4 DAYS Performed at [facility], [place] [address] [place], [place] [address]    Report Status PENDING  Incomplete  Blood Culture (routine x 2)     Status: None (Preliminary result)    Collection Time: 06/03/20  1:43 PM   Specimen: Left Antecubital; Blood  Result Value Ref Range Status   Specimen Description   Final    LEFT ANTECUBITAL Performed at [facility] [address], [place], [place] [address]    Special Requests   Final    BOTTLES DRAWN AEROBIC AND ANAEROBIC Blood Culture results may not be optimal due to an excessive volume of blood received in culture bottles Performed at [facility] [address] [place], [place] [address]    Culture   Final    NO GROWTH 4 DAYS Performed at [facility], [place] [address], [place], [place] [address]    Report Status PENDING  Incomplete     Signed: Marlowe Aschoff Davey Bergsma  [facility] 06/07/2020, 2:43 PM

## 2020-06-07 NOTE — Progress Notes (Addendum)
SATURATION QUALIFICATIONS: (This note is used to comply with regulatory documentation for home oxygen)  Patient Saturations on Room Air at Rest = 92%  Patient Saturations on Room Air while Ambulating =86%  Patient Saturations on 3 Liters of oxygen while Ambulating = 95%  Please briefly explain why patient needs home oxygen:

## 2020-06-07 NOTE — TOC Transition Note (Signed)
Transition of Care [facility]) - CM/SW Discharge Note   Patient Details  Name: Belinda Maxwell MRN: [id number] Date of Birth: 06/26/1966  Transition of Care [facility]) CM/SW Contact:  Trish Mage, LCSW Phone Number: 06/07/2020, 3:47 PM   Clinical Narrative:   COVID patient to d/c today, return home with support of family. SAT note and order seen and appreciated.  Lane Hacker with [employer] who will deliver O2 for transport home to patient's room today, and then follow up with home delivery in near future.  No further needs identified.  TOC sign off.    Final next level of care: Home/Self Care Barriers to Discharge: No Barriers Identified   Patient Goals and CMS Choice        Discharge Placement                       Discharge Plan and Services                                     Social Determinants of Health (SDOH) Interventions     Readmission Risk Interventions No flowsheet data found.

## 2020-06-07 NOTE — Discharge Instructions (Signed)
COVID-19 COVID-19 is a respiratory infection that is caused by a virus called severe acute respiratory syndrome coronavirus 2 (SARS-CoV-2). The disease is also known as coronavirus disease or novel coronavirus. In some people, the virus may not cause any symptoms. In others, it may cause a serious infection. The infection can get worse quickly and can lead to complications, such as:  Pneumonia, or infection of the lungs.  Acute respiratory distress syndrome or ARDS. This is a condition in which fluid build-up in the lungs prevents the lungs from filling with air and passing oxygen into the blood.  Acute respiratory failure. This is a condition in which there is not enough oxygen passing from the lungs to the body or when carbon dioxide is not passing from the lungs out of the body.  Sepsis or septic shock. This is a serious bodily reaction to an infection.  Blood clotting problems.  Secondary infections due to bacteria or fungus.  Organ failure. This is when your body's organs stop working. The virus that causes COVID-19 is contagious. This means that it can spread from person to person through droplets from coughs and sneezes (respiratory secretions). What are the causes? This illness is caused by a virus. You may catch the virus by:  Breathing in droplets from an infected person. Droplets can be spread by a person breathing, speaking, singing, coughing, or sneezing.  Touching something, like a table or a doorknob, that was exposed to the virus (contaminated) and then touching your mouth, nose, or eyes. What increases the risk? Risk for infection You are more likely to be infected with this virus if you:  Are within 6 feet (2 meters) of a person with COVID-19.  Provide care for or live with a person who is infected with COVID-19.  Spend time in crowded indoor spaces or live in shared housing. Risk for serious illness You are more likely to become seriously ill from the virus if you:   Are 50 years of age or older. The higher your age, the more you are at risk for serious illness.  Live in a nursing home or long-term care facility.  Have cancer.  Have a long-term (chronic) disease such as: ? Chronic lung disease, including chronic obstructive pulmonary disease or asthma. ? A long-term disease that lowers your body's ability to fight infection (immunocompromised). ? Heart disease, including heart failure, a condition in which the arteries that lead to the heart become narrow or blocked (coronary artery disease), a disease which makes the heart muscle thick, weak, or stiff (cardiomyopathy). ? Diabetes. ? Chronic kidney disease. ? Sickle cell disease, a condition in which red blood cells have an abnormal "sickle" shape. ? Liver disease.  Are obese. What are the signs or symptoms? Symptoms of this condition can range from mild to severe. Symptoms may appear any time from 2 to 14 days after being exposed to the virus. They include:  A fever or chills.  A cough.  Difficulty breathing.  Headaches, body aches, or muscle aches.  Runny or stuffy (congested) nose.  A sore throat.  New loss of taste or smell. Some people may also have stomach problems, such as nausea, vomiting, or diarrhea. Other people may not have any symptoms of COVID-19. How is this diagnosed? This condition may be diagnosed based on:  Your signs and symptoms, especially if: ? You live in an area with a COVID-19 outbreak. ? You recently traveled to or from an area where the virus is common. ? You   provide care for or live with a person who was diagnosed with COVID-19. ? You were exposed to a person who was diagnosed with COVID-19.  A physical exam.  Lab tests, which may include: ? Taking a sample of fluid from the back of your nose and throat (nasopharyngeal fluid), your nose, or your throat using a swab. ? A sample of mucus from your lungs (sputum). ? Blood tests.  Imaging tests, which  may include, X-rays, CT scan, or ultrasound. How is this treated? At present, there is no medicine to treat COVID-19. Medicines that treat other diseases are being used on a trial basis to see if they are effective against COVID-19. Your health care provider will talk with you about ways to treat your symptoms. For most people, the infection is mild and can be managed at home with rest, fluids, and over-the-counter medicines. Treatment for a serious infection usually takes places in a hospital intensive care unit (ICU). It may include one or more of the following treatments. These treatments are given until your symptoms improve.  Receiving fluids and medicines through an IV.  Supplemental oxygen. Extra oxygen is given through a tube in the nose, a face mask, or a hood.  Positioning you to lie on your stomach (prone position). This makes it easier for oxygen to get into the lungs.  Continuous positive airway pressure (CPAP) or bi-level positive airway pressure (BPAP) machine. This treatment uses mild air pressure to keep the airways open. A tube that is connected to a motor delivers oxygen to the body.  Ventilator. This treatment moves air into and out of the lungs by using a tube that is placed in your windpipe.  Tracheostomy. This is a procedure to create a hole in the neck so that a breathing tube can be inserted.  Extracorporeal membrane oxygenation (ECMO). This procedure gives the lungs a chance to recover by taking over the functions of the heart and lungs. It supplies oxygen to the body and removes carbon dioxide. Follow these instructions at home: Lifestyle  If you are sick, stay home except to get medical care. Your health care provider will tell you how long to stay home. Call your health care provider before you go for medical care.  Rest at home as told by your health care provider.  Do not use any products that contain nicotine or tobacco, such as cigarettes, e-cigarettes, and  chewing tobacco. If you need help quitting, ask your health care provider.  Return to your normal activities as told by your health care provider. Ask your health care provider what activities are safe for you. General instructions  Take over-the-counter and prescription medicines only as told by your health care provider.  Drink enough fluid to keep your urine pale yellow.  Keep all follow-up visits as told by your health care provider. This is important. How is this prevented?  There is no vaccine to help prevent COVID-19 infection. However, there are steps you can take to protect yourself and others from this virus. To protect yourself:   Do not travel to areas where COVID-19 is a risk. The areas where COVID-19 is reported change often. To identify high-risk areas and travel restrictions, check the CDC travel website: wwwnc.cdc.gov/travel/notices  If you live in, or must travel to, an area where COVID-19 is a risk, take precautions to avoid infection. ? Stay away from people who are sick. ? Wash your hands often with soap and water for 20 seconds. If soap and water   are not available, use an alcohol-based hand sanitizer. ? Avoid touching your mouth, face, eyes, or nose. ? Avoid going out in public, follow guidance from your state and local health authorities. ? If you must go out in public, wear a cloth face covering or face mask. Make sure your mask covers your nose and mouth. ? Avoid crowded indoor spaces. Stay at least 6 feet (2 meters) away from others. ? Disinfect objects and surfaces that are frequently touched every day. This may include:  Counters and tables.  Doorknobs and light switches.  Sinks and faucets.  Electronics, such as phones, remote controls, keyboards, computers, and tablets. To protect others: If you have symptoms of COVID-19, take steps to prevent the virus from spreading to others.  If you think you have a COVID-19 infection, contact your health care  provider right away. Tell your health care team that you think you may have a COVID-19 infection.  Stay home. Leave your house only to seek medical care. Do not use public transport.  Do not travel while you are sick.  Wash your hands often with soap and water for 20 seconds. If soap and water are not available, use alcohol-based hand sanitizer.  Stay away from other members of your household. Let healthy household members care for children and pets, if possible. If you have to care for children or pets, wash your hands often and wear a mask. If possible, stay in your own room, separate from others. Use a different bathroom.  Make sure that all people in your household wash their hands well and often.  Cough or sneeze into a tissue or your sleeve or elbow. Do not cough or sneeze into your hand or into the air.  Wear a cloth face covering or face mask. Make sure your mask covers your nose and mouth. Where to find more information  Centers for Disease Control and Prevention: www.cdc.gov/coronavirus/2019-ncov/index.html  World Health Organization: www.who.int/health-topics/coronavirus Contact a health care provider if:  You live in or have traveled to an area where COVID-19 is a risk and you have symptoms of the infection.  You have had contact with someone who has COVID-19 and you have symptoms of the infection. Get help right away if:  You have trouble breathing.  You have pain or pressure in your chest.  You have confusion.  You have bluish lips and fingernails.  You have difficulty waking from sleep.  You have symptoms that get worse. These symptoms may represent a serious problem that is an emergency. Do not wait to see if the symptoms will go away. Get medical help right away. Call your local emergency services (911 in the [place] Do not drive yourself to the hospital. Let the emergency medical personnel know if you think you have COVID-19. Summary  COVID-19 is a  respiratory infection that is caused by a virus. It is also known as coronavirus disease or novel coronavirus. It can cause serious infections, such as pneumonia, acute respiratory distress syndrome, acute respiratory failure, or sepsis.  The virus that causes COVID-19 is contagious. This means that it can spread from person to person through droplets from breathing, speaking, singing, coughing, or sneezing.  You are more likely to develop a serious illness if you are 50 years of age or older, have a weak immune system, live in a nursing home, or have chronic disease.  There is no medicine to treat COVID-19. Your health care provider will talk with you about ways to treat your symptoms.    Take steps to protect yourself and others from infection. Wash your hands often and disinfect objects and surfaces that are frequently touched every day. Stay away from people who are sick and wear a mask if you are sick. This information is not intended to replace advice given to you by your health care provider. Make sure you discuss any questions you have with your health care provider. Document Revised: 07/30/2019 Document Reviewed: 11/05/2018 Elsevier Patient Education  2020 [employer]  

## 2020-06-07 NOTE — Progress Notes (Signed)
Pt is being discharged home today with supplied O2 for home use. Discharge instructions including medications and follow up appointments given. Pt had no further questions at this time.

## 2020-06-08 ENCOUNTER — Telehealth: Payer: BC Managed Care – PPO | Admitting: Family Medicine

## 2020-06-08 DIAGNOSIS — U071 COVID-19: Secondary | ICD-10-CM | POA: Diagnosis not present

## 2020-06-08 LAB — CULTURE, BLOOD (ROUTINE X 2)
Culture: NO GROWTH
Culture: NO GROWTH
Special Requests: ADEQUATE

## 2020-06-14 ENCOUNTER — Telehealth (INDEPENDENT_AMBULATORY_CARE_PROVIDER_SITE_OTHER): Payer: BC Managed Care – PPO | Admitting: Family Medicine

## 2020-06-14 ENCOUNTER — Encounter: Payer: Self-pay | Admitting: Family Medicine

## 2020-06-14 VITALS — BP 109/74 | HR 81 | Temp 95.0°F | Wt 179.0 lb

## 2020-06-14 DIAGNOSIS — R002 Palpitations: Secondary | ICD-10-CM | POA: Diagnosis not present

## 2020-06-14 DIAGNOSIS — U071 COVID-19: Secondary | ICD-10-CM

## 2020-06-14 NOTE — Progress Notes (Signed)
Virtual Visit via Video Note  I connected with Belinda Maxwell  on 06/14/20 at 11:30 AM EDT by a video enabled telemedicine application and verified that I am speaking with the correct person using two identifiers.  Location patient: home Location provider: [place], [place] [id number] Persons participating in the virtual visit: patient, provider  I discussed the limitations of evaluation and management by telemedicine and the availability of in person appointments. The patient expressed understanding and agreed to proceed.   Belinda Maxwell DOB: [birth year] Encounter date: 06/14/2020  This is a 54 y.o. female who presents with Chief Complaint  Patient presents with   Hospitalization Follow-up    History of present illness: Admitted 06/03/2020, discharged 06/07/2020.  Patient presented with worsening of shortness of breath.  PCR positive outpatient testing on patient from 8/16.  Chest imaging revealed bilateral airspace opacities.  Patient was given 5 days of IV remdesivir along with Solu-Medrol.  She was switched to oral prednisone taper.  On discharge, patient was requiring 3 L of oxygen at rest.  She required more with ambulation.  Lungs are feeling better, but feels like heart is doing something crazy. At rest HR can be in 50's, but shoots up to 110-112 if she does activity.   Vitals: weight 179 Temp: 95 BP: 109/74 Pulse: 81 O2 sat: 95; states that she maintains even with activity. This morning dipped to 90 but hasn't dropped lower than this.   Not coughing now.   Appetite is increased; still on prednisone.   No headaches. NO fevers.   BP started creeping back up when she went home and she restarted 10mg ; but felt dizzy, so hasn't taken med since Sunday or Monday.   Working just 2 days a week/ but job is very high paced. Working with [employer], doing knee injections.    Allergies  Allergen Reactions   Sulfamethoxazole Hives    Current Meds  Medication Sig   acyclovir (ZOVIRAX) 400 MG tablet Take 1 tablet (400 mg total) by mouth 5 (five) times daily. At onset of rash (Patient taking differently: Take 400 mg by mouth 5 (five) times daily as needed (onset of cold sores). )   benzonatate (TESSALON PERLES) 100 MG capsule Take 1 capsule (100 mg total) by mouth 3 (three) times daily as needed.   DM-Doxylamine-Acetaminophen (NYQUIL COLD & FLU PO) Take 1 tablet by mouth 2 (two) times daily as needed (cold symptoms).   ibuprofen (ADVIL,MOTRIN) 200 MG tablet Take 200 mg by mouth every 6 (six) hours as needed.     lisinopril (ZESTRIL) 10 MG tablet TAKE 1 TABLET BY MOUTH EVERY DAY (Patient taking differently: Take 10 mg by mouth daily. )   meloxicam (MOBIC) 15 MG tablet TAKE 1 TABLET (15 MG TOTAL) BY MOUTH DAILY AS NEEDED FOR PAIN.   pantoprazole (PROTONIX) 40 MG tablet Take 1 tablet (40 mg total) by mouth daily for 10 days.   predniSONE (DELTASONE) 10 MG tablet Take 4 tabs twice a day for 2 days, then take 4 tabs daily for 2 days, then take 3 tabs daily for 2 days, then take 2 tabs daily for 2 days, then take 1 tab daily for 2 days, then STOP.   Pseudoephedrine-APAP-DM (DAYQUIL MULTI-SYMPTOM COLD/FLU PO) Take 1 tablet by mouth 2 (two) times daily as needed (cold symptoms).   SUMAtriptan (IMITREX) 100 MG tablet TAKE 1 TABLET BY MOUTH TWICE A DAY AS NEEDED (Patient taking differently: Take 100 mg  by mouth 2 (two) times daily as needed for migraine. )    Review of Systems  Constitutional: Positive for fatigue. Negative for chills and fever.  Respiratory: Negative for cough, chest tightness, shortness of breath and wheezing.   Cardiovascular: Negative for chest pain, palpitations and leg swelling.    Objective:  BP 109/74    Pulse 81    Temp (!) 95 F (35 C)    Wt 179 lb (81.2 kg)    SpO2 95%    BMI 28.89 kg/m   Weight: 179 lb (81.2 kg)   BP Readings from Last 3 Encounters:  06/20/20 109/74  06/07/20 130/81   06/02/20 102/72   Wt Readings from Last 3 Encounters:  06/20/20 179 lb (81.2 kg)  06/03/20 190 lb (86.2 kg)  06/01/20 185 lb (83.9 kg)    EXAM:  GENERAL: alert, oriented, appears well and in no acute distress  HEENT: atraumatic, conjunctiva clear, no obvious abnormalities on inspection of external nose and ears  NECK: normal movements of the head and neck  LUNGS: on inspection no signs of respiratory distress, breathing rate appears normal, no obvious gross SOB, gasping or wheezing  CV: no obvious cyanosis  MS: moves all visible extremities without noticeable abnormality  PSYCH/NEURO: pleasant and cooperative, no obvious depression or anxiety, speech and thought processing grossly intact   Assessment/Plan  1. COVID-19 Overall, she is recovering well from Covid infection.  Breathing is improved significantly, she is no longer requiring oxygen.  Appetite is very good.  She feels strong enough to consider going back to work next week.  2. Palpitations The seem to occur just with activity.  Otherwise breathing feels comfortable.  We discussed that prednisone may also be contributing.  I have asked her to continue to monitor as I would expect this to improve as she improves.  We discussed that differential for palpitations would include pulmonary embolism, which is why it is important for her to continue to monitor symptoms and update.  I suspect she will feel better as she completes prednisone taper, so of asked her to update me through my chart at that time.  Certainly sooner if any worsening of symptoms.    Return if symptoms worsen or fail to improve.   I discussed the assessment and treatment plan with the patient. The patient was provided an opportunity to ask questions and all were answered. The patient agreed with the plan and demonstrated an understanding of the instructions.   The patient was advised to call back or seek an in-person evaluation if the symptoms worsen or  if the condition fails to improve as anticipated.  I provided 25 minutes of non-face-to-face time during this encounter.   Micheline Rough, MD

## 2020-06-19 ENCOUNTER — Encounter: Payer: Self-pay | Admitting: Family Medicine

## 2020-06-20 ENCOUNTER — Telehealth: Payer: Self-pay | Admitting: Family Medicine

## 2020-06-20 ENCOUNTER — Other Ambulatory Visit: Payer: Self-pay | Admitting: Family Medicine

## 2020-06-20 NOTE — Telephone Encounter (Signed)
I have put letter in [place].   Please find out company she got oxygen from? We will have to write letter to d/c this. Please make sure that palpitations are doing better. Thanks!

## 2020-06-20 NOTE — Telephone Encounter (Signed)
Patient need a letter for work. First day out was 08/16/520121 and wants to go back to go back 06/22/2020  Please advise

## 2020-06-20 NOTE — Telephone Encounter (Signed)
Patient is aware the work note was sent to her Mychart and stated the oxygen came from [facility].  Patient stated the palpitations are the same and message was forwarded to PCP.

## 2020-06-20 NOTE — Telephone Encounter (Signed)
Patient was sent home with Oxygen from the ED and she doesn't need the oxygen anymore and needs an order placed for them to come pick up the oxygen tank.  Please advise

## 2020-06-21 ENCOUNTER — Other Ambulatory Visit: Payer: Self-pay | Admitting: Family Medicine

## 2020-06-21 DIAGNOSIS — U071 COVID-19: Secondary | ICD-10-CM

## 2020-06-21 NOTE — Telephone Encounter (Signed)
I ordered some follow up bloodwork since still having some palpitations that she can complete when able (please help set up appointment).   Please call [facility] to see what we need to do to d/c oxygen. Hopefully they have form we can complete or can let us know that process.

## 2020-06-21 NOTE — Telephone Encounter (Signed)
Order printed and faxed to [facility] at [phone].

## 2020-06-21 NOTE — Telephone Encounter (Signed)
Spoke with the pt and scheduled a lab appt for 9/10 to arrive at 3:30pm.  Per Shaniece at [facility], PCP would need to send a signed order in writing on a Rx pad stating to discontinue oxygen for the pt and requested this be faxed to [phone].  Message sent to PCP.

## 2020-06-21 NOTE — Telephone Encounter (Signed)
Order place to d/c oxygen (generic DME order). Please fax.

## 2020-06-23 ENCOUNTER — Other Ambulatory Visit: Payer: BC Managed Care – PPO

## 2020-06-23 ENCOUNTER — Other Ambulatory Visit: Payer: Self-pay

## 2020-06-23 ENCOUNTER — Other Ambulatory Visit: Payer: Self-pay | Admitting: Family Medicine

## 2020-06-23 DIAGNOSIS — U071 COVID-19: Secondary | ICD-10-CM

## 2020-06-24 LAB — COMPREHENSIVE METABOLIC PANEL
AG Ratio: 1.5 (calc) (ref 1.0–2.5)
ALT: 23 U/L (ref 6–29)
AST: 23 U/L (ref 10–35)
Albumin: 3.7 g/dL (ref 3.6–5.1)
Alkaline phosphatase (APISO): 59 U/L (ref 37–153)
BUN: 17 mg/dL (ref 7–25)
CO2: 25 mmol/L (ref 20–32)
Calcium: 9.2 mg/dL (ref 8.6–10.4)
Chloride: 106 mmol/L (ref 98–110)
Creat: 1.04 mg/dL (ref 0.50–1.05)
Globulin: 2.4 g/dL (calc) (ref 1.9–3.7)
Glucose, Bld: 100 mg/dL — ABNORMAL HIGH (ref 65–99)
Potassium: 3.8 mmol/L (ref 3.5–5.3)
Sodium: 138 mmol/L (ref 135–146)
Total Bilirubin: 0.5 mg/dL (ref 0.2–1.2)
Total Protein: 6.1 g/dL (ref 6.1–8.1)

## 2020-06-24 LAB — D-DIMER, QUANTITATIVE: D-Dimer, Quant: 0.87 mcg/mL FEU — ABNORMAL HIGH (ref <0.50)

## 2020-06-24 LAB — CBC WITH DIFFERENTIAL/PLATELET
Absolute Monocytes: 691 cells/uL (ref 200–950)
Basophils Absolute: 13 cells/uL (ref 0–200)
Basophils Relative: 0.2 %
Eosinophils Absolute: 141 cells/uL (ref 15–500)
Eosinophils Relative: 2.2 %
HCT: 33.3 % — ABNORMAL LOW (ref 35.0–45.0)
Hemoglobin: 11.1 g/dL — ABNORMAL LOW (ref 11.7–15.5)
Lymphs Abs: 1862 cells/uL (ref 850–3900)
MCH: 31.8 pg (ref 27.0–33.0)
MCHC: 33.3 g/dL (ref 32.0–36.0)
MCV: 95.4 fL (ref 80.0–100.0)
MPV: 9.6 fL (ref 7.5–12.5)
Monocytes Relative: 10.8 %
Neutro Abs: 3693 cells/uL (ref 1500–7800)
Neutrophils Relative %: 57.7 %
Platelets: 183 10*3/uL (ref 140–400)
RBC: 3.49 10*6/uL — ABNORMAL LOW (ref 3.80–5.10)
RDW: 13.6 % (ref 11.0–15.0)
Total Lymphocyte: 29.1 %
WBC: 6.4 10*3/uL (ref 3.8–10.8)

## 2020-06-27 NOTE — Progress Notes (Signed)
Noted.  Great!

## 2020-06-29 ENCOUNTER — Encounter: Payer: Self-pay | Admitting: Family Medicine

## 2020-06-29 NOTE — Telephone Encounter (Signed)
The patient called needing an order sent to [facility] so she can return the oxygen that was sent home with her.  Fax number: [phone]

## 2020-06-29 NOTE — Telephone Encounter (Signed)
Left a detailed message at the pts home number stating the order was faxed to [facility] 8 days ago as noted below.

## 2020-08-11 ENCOUNTER — Ambulatory Visit (INDEPENDENT_AMBULATORY_CARE_PROVIDER_SITE_OTHER): Payer: BC Managed Care – PPO | Admitting: Family Medicine

## 2020-08-11 ENCOUNTER — Ambulatory Visit (INDEPENDENT_AMBULATORY_CARE_PROVIDER_SITE_OTHER): Payer: BC Managed Care – PPO

## 2020-08-11 ENCOUNTER — Other Ambulatory Visit: Payer: Self-pay

## 2020-08-11 ENCOUNTER — Encounter: Payer: Self-pay | Admitting: Family Medicine

## 2020-08-11 VITALS — BP 108/80 | HR 54 | Temp 98.2°F | Ht 67.0 in | Wt 184.3 lb

## 2020-08-11 DIAGNOSIS — H9190 Unspecified hearing loss, unspecified ear: Secondary | ICD-10-CM

## 2020-08-11 DIAGNOSIS — R0602 Shortness of breath: Secondary | ICD-10-CM

## 2020-08-11 DIAGNOSIS — Z91018 Allergy to other foods: Secondary | ICD-10-CM | POA: Diagnosis not present

## 2020-08-11 DIAGNOSIS — E559 Vitamin D deficiency, unspecified: Secondary | ICD-10-CM | POA: Diagnosis not present

## 2020-08-11 DIAGNOSIS — L659 Nonscarring hair loss, unspecified: Secondary | ICD-10-CM

## 2020-08-11 DIAGNOSIS — Z0000 Encounter for general adult medical examination without abnormal findings: Secondary | ICD-10-CM | POA: Diagnosis not present

## 2020-08-11 DIAGNOSIS — E785 Hyperlipidemia, unspecified: Secondary | ICD-10-CM

## 2020-08-11 DIAGNOSIS — I10 Essential (primary) hypertension: Secondary | ICD-10-CM | POA: Diagnosis not present

## 2020-08-11 DIAGNOSIS — H903 Sensorineural hearing loss, bilateral: Secondary | ICD-10-CM | POA: Diagnosis not present

## 2020-08-11 MED ORDER — MELOXICAM 15 MG PO TABS
ORAL_TABLET | ORAL | 2 refills | Status: DC
Start: 2020-08-11 — End: 2020-11-06

## 2020-08-11 MED ORDER — LISINOPRIL 5 MG PO TABS: 5.0000 mg | ORAL_TABLET | Freq: Every day | ORAL | 3 refills | Status: DC

## 2020-08-11 NOTE — Progress Notes (Signed)
Belinda Maxwell DOB: 02-Nov-1965 Encounter date: 08/11/2020  This is a 54 y.o. female who presents for complete physical   History of present illness/Additional concerns:  Was having some issues with sob when going up hils prior to COVID, worse after covid and getting better, but still noting difficulty with catching breath. Not sure she is still improving. No other symptoms along with this. Does fine on flat surface; just feels a little weighted/oxygen hungry with exertion. She is trying to get back into regular exercise. Working on walking in neighborhood; not back to 2 miles daily she was doing before.  Has been back for a couple of months at new job.    cologuard negative 05/2019.   HTN: taking lisinopril regularly but just doing the 5mg . No more issues with cutting back on the dose from 10mg  to 5mg .   Migraine: doing well; none in awhile.   Acid reflux doing well; not on protonix (just in hospital)  Uses mobic maybe once or twice a month - uses when back bothering her.   mammogram:normal 08/2019 Last pap was 05/2016 and was normal with negative hpv (done by Dr. Nunzio Cobbs through [facility])  Does take vitamin D supplement; not sure of dose.   Did see dermatology last year; does follow regularly.   Past Medical History:  Diagnosis Date   Headache 02/03/2008   Qualifier: Diagnosis of  By: Leanne Chang MD, Bruce  Menstrual related    Headache(784.0)    Hearing loss    Hemorrhoid 01/11/2016   -sees surgeon in [place] 01/11/2016   -seeing gynecologist    Hyperlipemia 01/11/2016   Hypertension 04/19/2010   Qualifier: Diagnosis of  By: Leanne Chang MD, Bruce      Pre-eclampsia    Past Surgical History:  Procedure Laterality Date   CHOLECYSTECTOMY     hemorrhoid banding     Allergies  Allergen Reactions   Sulfamethoxazole Hives   Current Meds  Medication Sig   acyclovir (ZOVIRAX) 400 MG tablet Take 1 tablet (400 mg total) by mouth 5 (five) times daily. At  onset of rash (Patient taking differently: Take 400 mg by mouth 5 (five) times daily as needed (onset of cold sores). )   DM-Doxylamine-Acetaminophen (NYQUIL COLD & FLU PO) Take 1 tablet by mouth 2 (two) times daily as needed (cold symptoms).   lisinopril (ZESTRIL) 10 MG tablet TAKE 1 TABLET BY MOUTH EVERY DAY (Patient taking differently: Take 10 mg by mouth daily. )   meloxicam (MOBIC) 15 MG tablet TAKE 1 TABLET (15 MG TOTAL) BY MOUTH DAILY AS NEEDED FOR PAIN.   SUMAtriptan (IMITREX) 100 MG tablet TAKE 1 TABLET BY MOUTH TWICE A DAY AS NEEDED (Patient taking differently: Take 100 mg by mouth 2 (two) times daily as needed for migraine. )   [DISCONTINUED] benzonatate (TESSALON PERLES) 100 MG capsule Take 1 capsule (100 mg total) by mouth 3 (three) times daily as needed.   [DISCONTINUED] ibuprofen (ADVIL,MOTRIN) 200 MG tablet Take 200 mg by mouth every 6 (six) hours as needed.     [DISCONTINUED] meloxicam (MOBIC) 15 MG tablet TAKE 1 TABLET (15 MG TOTAL) BY MOUTH DAILY AS NEEDED FOR PAIN.   [DISCONTINUED] predniSONE (DELTASONE) 10 MG tablet Take 4 tabs twice a day for 2 days, then take 4 tabs daily for 2 days, then take 3 tabs daily for 2 days, then take 2 tabs daily for 2 days, then take 1 tab daily for 2 days, then STOP.   [DISCONTINUED]  Pseudoephedrine-APAP-DM (DAYQUIL MULTI-SYMPTOM COLD/FLU PO) Take 1 tablet by mouth 2 (two) times daily as needed (cold symptoms).   Social History   Tobacco Use   Smoking status: Never Smoker   Smokeless tobacco: Never Used  Substance Use Topics   Alcohol use: No   Family History  Problem Relation Age of Onset   Hyperlipidemia Mother    Hypertension Mother    Migraines Mother    Glaucoma Mother    Cataracts Mother    Macular degeneration Mother    Diabetes Mother        prediabetic   Kidney disease Mother    Obesity Mother    Breast cancer Mother 30       mastectomy; removed with surgery   Hyperlipidemia Father    Hypertension  Father    Heart disease Father 3       AFIB   Obesity Father    Migraines Sister    Stroke Sister 34       secondary to PFO   Hypothyroidism Sister    Migraines Son    Obesity Maternal Grandmother    Arthritis Maternal Grandmother    Alcohol abuse Maternal Grandfather    Obesity Paternal Grandmother    Diabetes Paternal Grandmother    Gallbladder disease Paternal Grandfather      Review of Systems  Constitutional: Negative for activity change, appetite change, chills, fatigue, fever and unexpected weight change.  HENT: Negative for congestion, ear pain, hearing loss, sinus pressure, sinus pain, sore throat and trouble swallowing.   Eyes: Negative for pain and visual disturbance.  Respiratory: Positive for shortness of breath (with going up hills). Negative for cough, chest tightness and wheezing.   Cardiovascular: Negative for chest pain, palpitations and leg swelling.  Gastrointestinal: Negative for abdominal pain, blood in stool, constipation, diarrhea, nausea and vomiting.  Genitourinary: Negative for difficulty urinating and menstrual problem.  Musculoskeletal: Negative for arthralgias and back pain.  Skin: Negative for rash.  Neurological: Negative for dizziness, weakness, numbness and headaches.  Hematological: Negative for adenopathy. Does not bruise/bleed easily.  Psychiatric/Behavioral: Negative for sleep disturbance and suicidal ideas. The patient is not nervous/anxious.     CBC:  Lab Results  Component Value Date   WBC 6.4 06/23/2020   HGB 11.1 (L) 06/23/2020   HCT 33.3 (L) 06/23/2020   MCH 31.8 06/23/2020   MCHC 33.3 06/23/2020   RDW 13.6 06/23/2020   PLT 183 06/23/2020   MPV 9.6 06/23/2020   CMP: Lab Results  Component Value Date   NA 138 06/23/2020   K 3.8 06/23/2020   CL 106 06/23/2020   CO2 25 06/23/2020   ANIONGAP 8 06/07/2020   GLUCOSE 100 (H) 06/23/2020   BUN 17 06/23/2020   CREATININE 1.04 06/23/2020   GFRAA >60 06/07/2020    CALCIUM 9.2 06/23/2020   PROT 6.1 06/23/2020   BILITOT 0.5 06/23/2020   ALKPHOS 76 06/07/2020   ALT 23 06/23/2020   AST 23 06/23/2020   LIPID: Lab Results  Component Value Date   CHOL 213 (H) 11/26/2019   TRIG 150 (H) 06/03/2020   HDL 54.00 11/26/2019   LDLCALC 129 (H) 11/26/2019    Objective:  BP 108/80 (BP Location: Left Arm, Patient Position: Sitting, Cuff Size: Large)    Pulse (!) 54    Temp 98.2 F (36.8 C) (Oral)    Ht 5\' 7"  (1.702 m)    Wt 184 lb 4.8 oz (83.6 kg)    BMI 28.87 kg/m   Weight:  184 lb 4.8 oz (83.6 kg)   BP Readings from Last 3 Encounters:  08/11/20 108/80  06/20/20 109/74  06/07/20 130/81   Wt Readings from Last 3 Encounters:  08/11/20 184 lb 4.8 oz (83.6 kg)  06/20/20 179 lb (81.2 kg)  06/03/20 190 lb (86.2 kg)    Physical Exam Constitutional:      General: She is not in acute distress.    Appearance: She is well-developed.  HENT:     Head: Normocephalic and atraumatic.     Right Ear: External ear normal.     Left Ear: External ear normal.     Mouth/Throat:     Pharynx: No oropharyngeal exudate.  Eyes:     Conjunctiva/sclera: Conjunctivae normal.     Pupils: Pupils are equal, round, and reactive to light.  Neck:     Thyroid: No thyromegaly.  Cardiovascular:     Rate and Rhythm: Normal rate and regular rhythm.     Heart sounds: Normal heart sounds. No murmur heard.  No friction rub. No gallop.   Pulmonary:     Effort: Pulmonary effort is normal.     Breath sounds: Normal breath sounds.  Abdominal:     General: Bowel sounds are normal. There is no distension.     Palpations: Abdomen is soft. There is no mass.     Tenderness: There is no abdominal tenderness. There is no guarding.     Hernia: No hernia is present.  Musculoskeletal:        General: No tenderness or deformity. Normal range of motion.     Cervical back: Normal range of motion and neck supple.  Lymphadenopathy:     Cervical: No cervical adenopathy.  Skin:    General:  Skin is warm and dry.     Findings: No rash.  Neurological:     Mental Status: She is alert and oriented to person, place, and time.     Deep Tendon Reflexes: Reflexes normal.     Reflex Scores:      Tricep reflexes are 2+ on the right side and 2+ on the left side.      Bicep reflexes are 2+ on the right side and 2+ on the left side.      Brachioradialis reflexes are 2+ on the right side and 2+ on the left side.      Patellar reflexes are 2+ on the right side and 2+ on the left side. Psychiatric:        Speech: Speech normal.        Behavior: Behavior normal.        Thought Content: Thought content normal.     Assessment/Plan: Health Maintenance Due  Topic Date Due   PAP SMEAR-Modifier  06/07/2019   Health Maintenance reviewed - she will consider COVID vaccination and flu shot in another month; still trying to recover from [place] before completing.  1. Preventative health care Keep up with exercise; just getting on optiva program.   2. Shortness of breath Start with chest xray; further eval pending this results. - DG Chest 2 View; Future  3. Primary hypertension Well controlled.  - CBC with Differential/Platelet; Future - Comprehensive metabolic panel; Future  4. Hyperlipidemia, unspecified hyperlipidemia type - Lipid panel; Future  5. Hair loss Suspect secondary to COVID 6. Hearing loss, unspecified hearing loss type, unspecified laterality Follows with audiology  7. Nut allergy stable - Allergy Panel 18, Nut [employer]; Future  8. Vitamin D deficiency - VITAMIN D 25  Hydroxy (Vit-D Deficiency, Fractures); Future  Return in about 6 months (around 02/09/2021) for Chronic condition visit.  Micheline Rough, MD

## 2020-08-14 LAB — CBC WITH DIFFERENTIAL/PLATELET
Absolute Monocytes: 506 cells/uL (ref 200–950)
Basophils Absolute: 41 cells/uL (ref 0–200)
Basophils Relative: 0.9 %
Eosinophils Absolute: 317 cells/uL (ref 15–500)
Eosinophils Relative: 6.9 %
HCT: 38.5 % (ref 35.0–45.0)
Hemoglobin: 13.4 g/dL (ref 11.7–15.5)
Lymphs Abs: 1739 cells/uL (ref 850–3900)
MCH: 33.3 pg — ABNORMAL HIGH (ref 27.0–33.0)
MCHC: 34.8 g/dL (ref 32.0–36.0)
MCV: 95.8 fL (ref 80.0–100.0)
MPV: 9.6 fL (ref 7.5–12.5)
Monocytes Relative: 11 %
Neutro Abs: 1996 cells/uL (ref 1500–7800)
Neutrophils Relative %: 43.4 %
Platelets: 294 10*3/uL (ref 140–400)
RBC: 4.02 10*6/uL (ref 3.80–5.10)
RDW: 14 % (ref 11.0–15.0)
Total Lymphocyte: 37.8 %
WBC: 4.6 10*3/uL (ref 3.8–10.8)

## 2020-08-14 LAB — ALLERGY PANEL 18, NUT MIX GROUP
Almonds: <0.1 kU/L
CLASS: 0
CLASS: 0
CLASS: 0
CLASS: 0
CLASS: 0
CLASS: 0
Cashew IgE: <0.1 kU/L
Class: 0
Coconut: <0.1 kU/L
Hazelnut: <0.1 kU/L
Peanut IgE: <0.1 kU/L
Pecan Nut: <0.1 kU/L
Sesame Seed f10: <0.1 kU/L

## 2020-08-14 LAB — LIPID PANEL
Cholesterol: 177 mg/dL (ref <200)
HDL: 60 mg/dL (ref >=50)
LDL Cholesterol (Calc): 101 mg/dL (calc) — ABNORMAL HIGH
Non-HDL Cholesterol (Calc): 117 mg/dL (calc) (ref <130)
Total CHOL/HDL Ratio: 3 (calc) (ref <5.0)
Triglycerides: 71 mg/dL (ref <150)

## 2020-08-14 LAB — COMPREHENSIVE METABOLIC PANEL
AG Ratio: 2 (calc) (ref 1.0–2.5)
ALT: 26 U/L (ref 6–29)
AST: 32 U/L (ref 10–35)
Albumin: 4.7 g/dL (ref 3.6–5.1)
Alkaline phosphatase (APISO): 63 U/L (ref 37–153)
BUN/Creatinine Ratio: 20 (calc) (ref 6–22)
BUN: 22 mg/dL (ref 7–25)
CO2: 25 mmol/L (ref 20–32)
Calcium: 10.5 mg/dL — ABNORMAL HIGH (ref 8.6–10.4)
Chloride: 103 mmol/L (ref 98–110)
Creat: 1.12 mg/dL — ABNORMAL HIGH (ref 0.50–1.05)
Globulin: 2.4 g/dL (calc) (ref 1.9–3.7)
Glucose, Bld: 87 mg/dL (ref 65–99)
Potassium: 4.3 mmol/L (ref 3.5–5.3)
Sodium: 138 mmol/L (ref 135–146)
Total Bilirubin: 0.7 mg/dL (ref 0.2–1.2)
Total Protein: 7.1 g/dL (ref 6.1–8.1)

## 2020-08-14 LAB — VITAMIN D 25 HYDROXY (VIT D DEFICIENCY, FRACTURES): Vit D, 25-Hydroxy: 64 ng/mL (ref 30–100)

## 2020-08-14 LAB — INTERPRETATION:

## 2020-08-23 NOTE — Progress Notes (Signed)
Noted.  Great!

## 2020-08-25 ENCOUNTER — Other Ambulatory Visit: Payer: Self-pay | Admitting: Family Medicine

## 2020-08-30 ENCOUNTER — Other Ambulatory Visit: Payer: Self-pay | Admitting: Family Medicine

## 2020-08-30 DIAGNOSIS — I10 Essential (primary) hypertension: Secondary | ICD-10-CM

## 2020-09-11 DIAGNOSIS — H52203 Unspecified astigmatism, bilateral: Secondary | ICD-10-CM | POA: Diagnosis not present

## 2020-09-11 DIAGNOSIS — H35362 Drusen (degenerative) of macula, left eye: Secondary | ICD-10-CM | POA: Diagnosis not present

## 2020-09-11 DIAGNOSIS — H524 Presbyopia: Secondary | ICD-10-CM | POA: Diagnosis not present

## 2020-09-18 DIAGNOSIS — H903 Sensorineural hearing loss, bilateral: Secondary | ICD-10-CM | POA: Diagnosis not present

## 2020-11-05 ENCOUNTER — Other Ambulatory Visit: Payer: Self-pay | Admitting: Family Medicine

## 2020-12-13 DIAGNOSIS — Z1231 Encounter for screening mammogram for malignant neoplasm of breast: Secondary | ICD-10-CM | POA: Diagnosis not present

## 2020-12-20 DIAGNOSIS — R922 Inconclusive mammogram: Secondary | ICD-10-CM | POA: Diagnosis not present

## 2020-12-20 DIAGNOSIS — R928 Other abnormal and inconclusive findings on diagnostic imaging of breast: Secondary | ICD-10-CM | POA: Diagnosis not present

## 2021-06-05 ENCOUNTER — Other Ambulatory Visit: Payer: Self-pay | Admitting: Family Medicine

## 2021-06-05 DIAGNOSIS — I10 Essential (primary) hypertension: Secondary | ICD-10-CM

## 2021-06-07 ENCOUNTER — Other Ambulatory Visit: Payer: Self-pay | Admitting: Family Medicine

## 2021-06-18 ENCOUNTER — Other Ambulatory Visit: Payer: Self-pay | Admitting: Family Medicine

## 2021-06-29 ENCOUNTER — Other Ambulatory Visit: Payer: Self-pay | Admitting: Family Medicine

## 2021-06-29 DIAGNOSIS — I10 Essential (primary) hypertension: Secondary | ICD-10-CM

## 2021-07-07 IMAGING — DX DG CHEST 1V PORT
1 series · 1 of 1 positions shown · non-contrast
Comparison: October 09, 2011, most recent comparison

CLINICAL DATA: COVID positive on [REDACTED]

EXAM:
PORTABLE CHEST 1 VIEW

[chest ap]
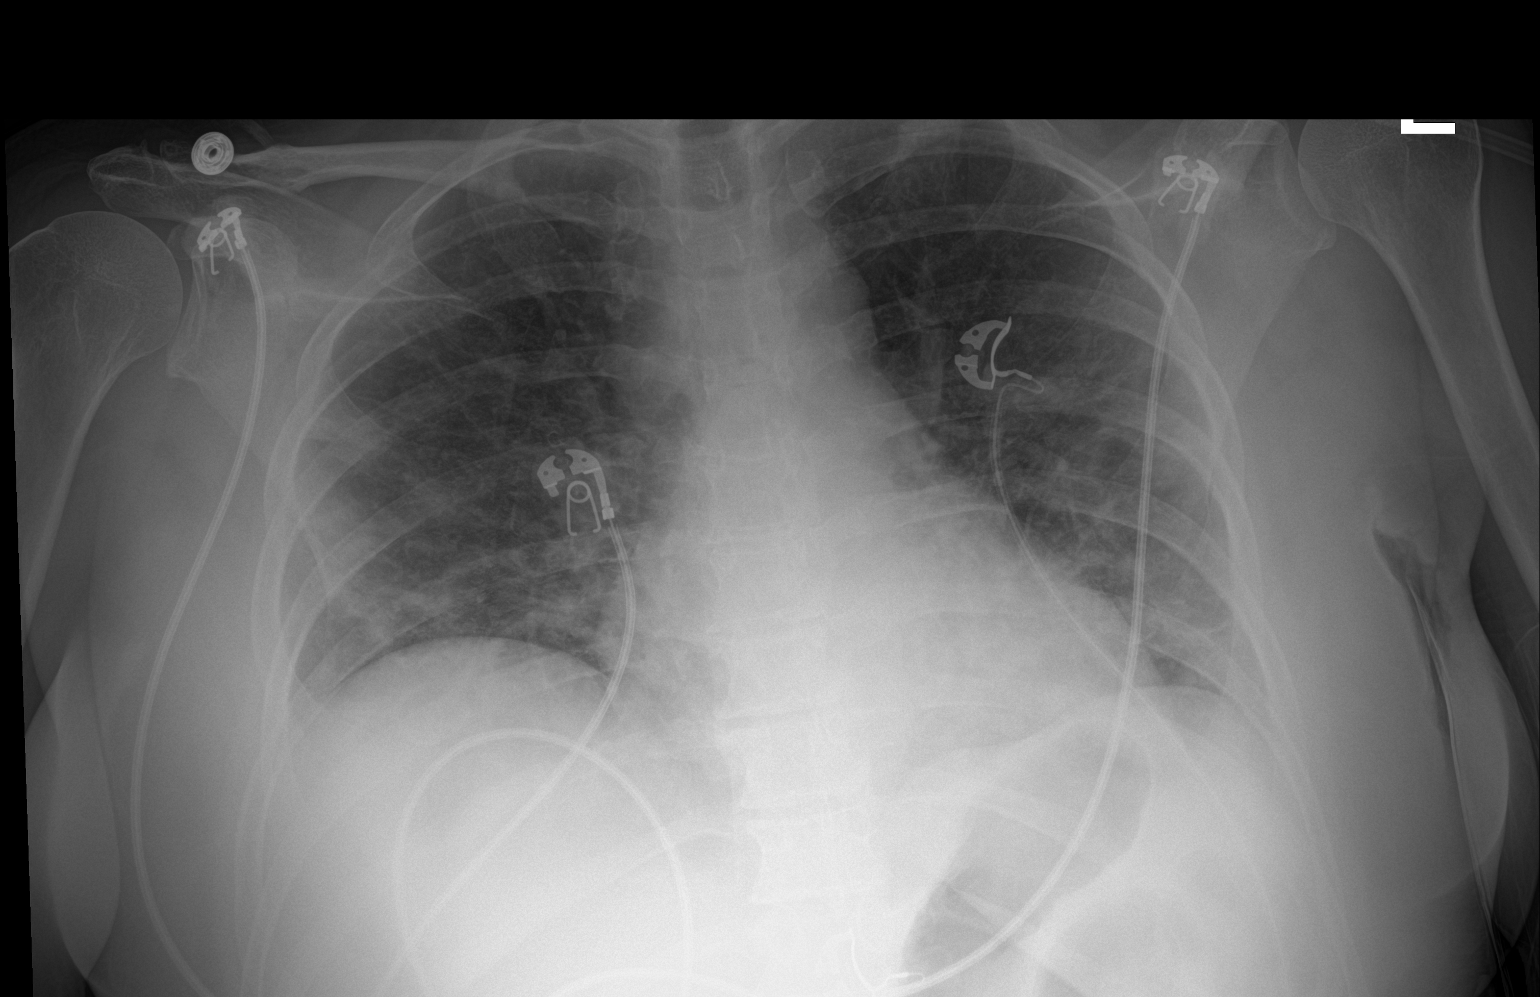

[1 of 1 positions shown; findings below may reference images not displayed]

FINDINGS: Trachea is midline. Cardiomediastinal contours are normal accounting
for low lung volumes, partially obscured by patchy bilateral
airspace disease.

Hilar structures are normal.

Bilateral patchy and peripheral airspace opacities with basilar and
mid chest predominance. No sign of pleural effusion.

On limited assessment skeletal structures without acute process.
IMPRESSION: Patchy bilateral airspace opacities compatible with multifocal
pneumonia in the setting of [id number]-DD infection.

## 2021-07-29 ENCOUNTER — Other Ambulatory Visit: Payer: Self-pay | Admitting: Family Medicine

## 2021-07-29 DIAGNOSIS — I10 Essential (primary) hypertension: Secondary | ICD-10-CM

## 2021-08-24 ENCOUNTER — Other Ambulatory Visit: Payer: Self-pay | Admitting: Family Medicine

## 2021-08-24 DIAGNOSIS — I10 Essential (primary) hypertension: Secondary | ICD-10-CM

## 2021-08-31 ENCOUNTER — Telehealth (INDEPENDENT_AMBULATORY_CARE_PROVIDER_SITE_OTHER): Payer: No Typology Code available for payment source | Admitting: Family Medicine

## 2021-08-31 ENCOUNTER — Telehealth: Payer: Self-pay | Admitting: *Deleted

## 2021-08-31 ENCOUNTER — Encounter: Payer: Self-pay | Admitting: Family Medicine

## 2021-08-31 ENCOUNTER — Telehealth: Payer: Self-pay | Admitting: Family Medicine

## 2021-08-31 VITALS — BP 122/86 | HR 75 | Wt 172.0 lb

## 2021-08-31 DIAGNOSIS — I10 Essential (primary) hypertension: Secondary | ICD-10-CM

## 2021-08-31 DIAGNOSIS — J029 Acute pharyngitis, unspecified: Secondary | ICD-10-CM | POA: Diagnosis not present

## 2021-08-31 DIAGNOSIS — J040 Acute laryngitis: Secondary | ICD-10-CM

## 2021-08-31 DIAGNOSIS — R059 Cough, unspecified: Secondary | ICD-10-CM | POA: Diagnosis not present

## 2021-08-31 MED ORDER — AMOXICILLIN 500 MG PO CAPS: 1000.0000 mg | ORAL_CAPSULE | Freq: Two times a day (BID) | ORAL | 0 refills | Status: AC

## 2021-08-31 MED ORDER — LISINOPRIL 10 MG PO TABS: 10.0000 mg | ORAL_TABLET | Freq: Every day | ORAL | 0 refills | Status: DC

## 2021-08-31 MED ORDER — BENZONATATE 100 MG PO CAPS: 100.0000 mg | ORAL_CAPSULE | Freq: Three times a day (TID) | ORAL | 0 refills | Status: AC

## 2021-08-31 NOTE — Telephone Encounter (Signed)
Patient calling in with respiratory symptoms: Shortness of breath, chest pain, palpitations or other red words send to Triage  Does the patient have a fever over 100, cough, congestion, sore throat, runny nose, lost of taste/smell within the last 5 days (please list symptoms that patient has)? Sore throat, voice is leaving her, and ear pain  Have you tested for Covid in the last 5 days? No   If yes, was it positive []  OR negative [] ? If positive in the last 5 days, please schedule virtual visit now. If negative, schedule for an in person OV with the next available provider if PCP has no openings. Please also let patient know they will be tested again (follow the script below)  "you will have to arrive 65mins prior to your appt time to be Covid tested. Please park in back of office at the cone & call [phone] to let the staff know you have arrived. A staff member will meet you at your car to do a rapid covid test. Once the test has resulted you will be notified by phone of your results to determine if appt will remain an in person visit or be converted to a virtual/phone visit."  Pt has virtual with dr Ethlyn Gallery at 3pm today  [place]  If no availability for virtual visit in office,  please schedule another [place] office  If no availability at another [place] office, please instruct patient that they can schedule an evisit or virtual visit through their mychart account. Visits up to 8pm  patients can be seen in office 5 days after positive COVID test

## 2021-08-31 NOTE — Telephone Encounter (Signed)
Spoke with the patient and scheduled a physical exam on 12/28.

## 2021-08-31 NOTE — Progress Notes (Signed)
Virtual Visit via Video Note  I connected with Belinda Maxwell  on 08/31/21 at  3:00 PM EST by a video enabled telemedicine application and verified that I am speaking with the correct person using two identifiers.  Location patient: home Location provider: [place], [place] [address] Persons participating in the virtual visit: patient, provider  I discussed the limitations of evaluation and management by telemedicine and the availability of in person appointments. The patient expressed understanding and agreed to proceed.   Belinda Maxwell DOB: [birth year] Encounter date: 08/31/2021  This is a 55 y.o. 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 Started weds night again. Last night started losing voice and coughing again. Checked temp today and no fever. Doesn't feel like she has had fever. Appetite is ok, but decreased today. No drainage from ears. Thinks that the ear discomfort is more from throat.   Naprosyn is only thing that has helped throat pain.   Covid test last Friday negative.   No stomach issues, no diarrhea, no vomiting. Doesn't feel like she fully recovered between each episode.   Doesn't feel like glands are swollen. Throat looked ok to her. Had a headache this weekend. Woke Sunday with headache and then felt better. Cough hasn't been productive through. Husband thinks it sounds deep. Pulse ox 98%.   Allergies  Allergen Reactions   Sulfamethoxazole Hives   Current Meds  Medication Sig   acyclovir (ZOVIRAX) 400 MG tablet TAKE 1 TABLET (400 MG TOTAL) BY MOUTH 5 (FIVE) TIMES DAILY. START AT ONSET OF RASH   amoxicillin (AMOXIL) 500 MG capsule Take 2 capsules  (1,000 mg total) by mouth 2 (two) times daily for 10 days.   meloxicam (MOBIC) 15 MG tablet TAKE 1 TABLET BY MOUTH EVERY DAY AS NEEDED FOR PAIN   SUMAtriptan (IMITREX) 100 MG tablet TAKE 1 TABLET BY MOUTH TWICE A DAY AS NEEDED   [DISCONTINUED] lisinopril (ZESTRIL) 10 MG tablet TAKE 1 TABLET BY MOUTH EVERY DAY    Review of Systems  Constitutional:  Negative for chills and fever.  HENT:  Positive for congestion, postnasal drip and sore throat. Negative for ear pain, sinus pressure and sinus pain.   Respiratory:  Positive for cough. Negative for shortness of breath and wheezing.   Cardiovascular:  Negative for chest pain.   Objective:  There were no vitals taken for this visit.      BP Readings from Last 3 Encounters:  08/11/20 108/80  06/20/20 109/74  06/07/20 130/81   Wt Readings from Last 3 Encounters:  08/11/20 184 lb 4.8 oz (83.6 kg)  06/20/20 179 lb (81.2 kg)  06/03/20 190 lb (86.2 kg)    EXAM:  GENERAL: alert, oriented, appears well and in no acute distress  HEENT: atraumatic, conjunctiva clear, no obvious abnormalities on inspection of external nose and ears  NECK: normal movements of the head and neck  LUNGS: on inspection no signs of respiratory distress, breathing rate appears normal, no obvious gross SOB, gasping or wheezing. Voice is hoarse and she does cough occasionally.  CV: no obvious cyanosis  MS: moves all  visible extremities without noticeable abnormality  PSYCH/NEURO: pleasant and cooperative, no obvious depression or anxiety, speech and thought processing grossly intact   Assessment/Plan  1. Essential hypertension Continue with current medication. She has follow up visit set up for [facility].  - lisinopril (ZESTRIL) 10 MG tablet; Take 1 tablet (10 mg total) by mouth daily.  Dispense: 90 tablet; Refill: 0  2. Sore throat Suspect viral illness, but in case of worsening through weekend, will send in amoxicillin. Otc treatment with warm liquids, honey,  voice rest. Tessalon perles for cough. Mucinex. If worsening sx let us know.   3. Laryngitis See above  4. Cough, unspecified type See above  Return in about 1 month (around 09/30/2021) for physical exam.    I discussed the assessment and treatment plan with the patient. The patient was provided an opportunity to ask questions and all were answered. The patient agreed with the plan and demonstrated an understanding of the instructions.   The patient was advised to call back or seek an in-person evaluation if the symptoms worsen or if the condition fails to improve as anticipated.  I provided 20 minutes of face-to-face time during this encounter.   Micheline Rough, MD

## 2021-08-31 NOTE — Telephone Encounter (Signed)
-----   Message from Caren Macadam, MD sent at 08/31/2021  4:01 PM EST ----- Can you change her next visit to about 1 month out and a physical? Thanks!

## 2021-09-10 ENCOUNTER — Ambulatory Visit: Payer: No Typology Code available for payment source | Admitting: Family Medicine

## 2021-09-14 IMAGING — DX DG CHEST 2V
2 series · 2 of 2 positions shown · non-contrast
Comparison: June 03, 2020

CLINICAL DATA: Shortness of breath with exertion x 2-3 months.

EXAM:
CHEST - 2 VIEW

[chest pa]
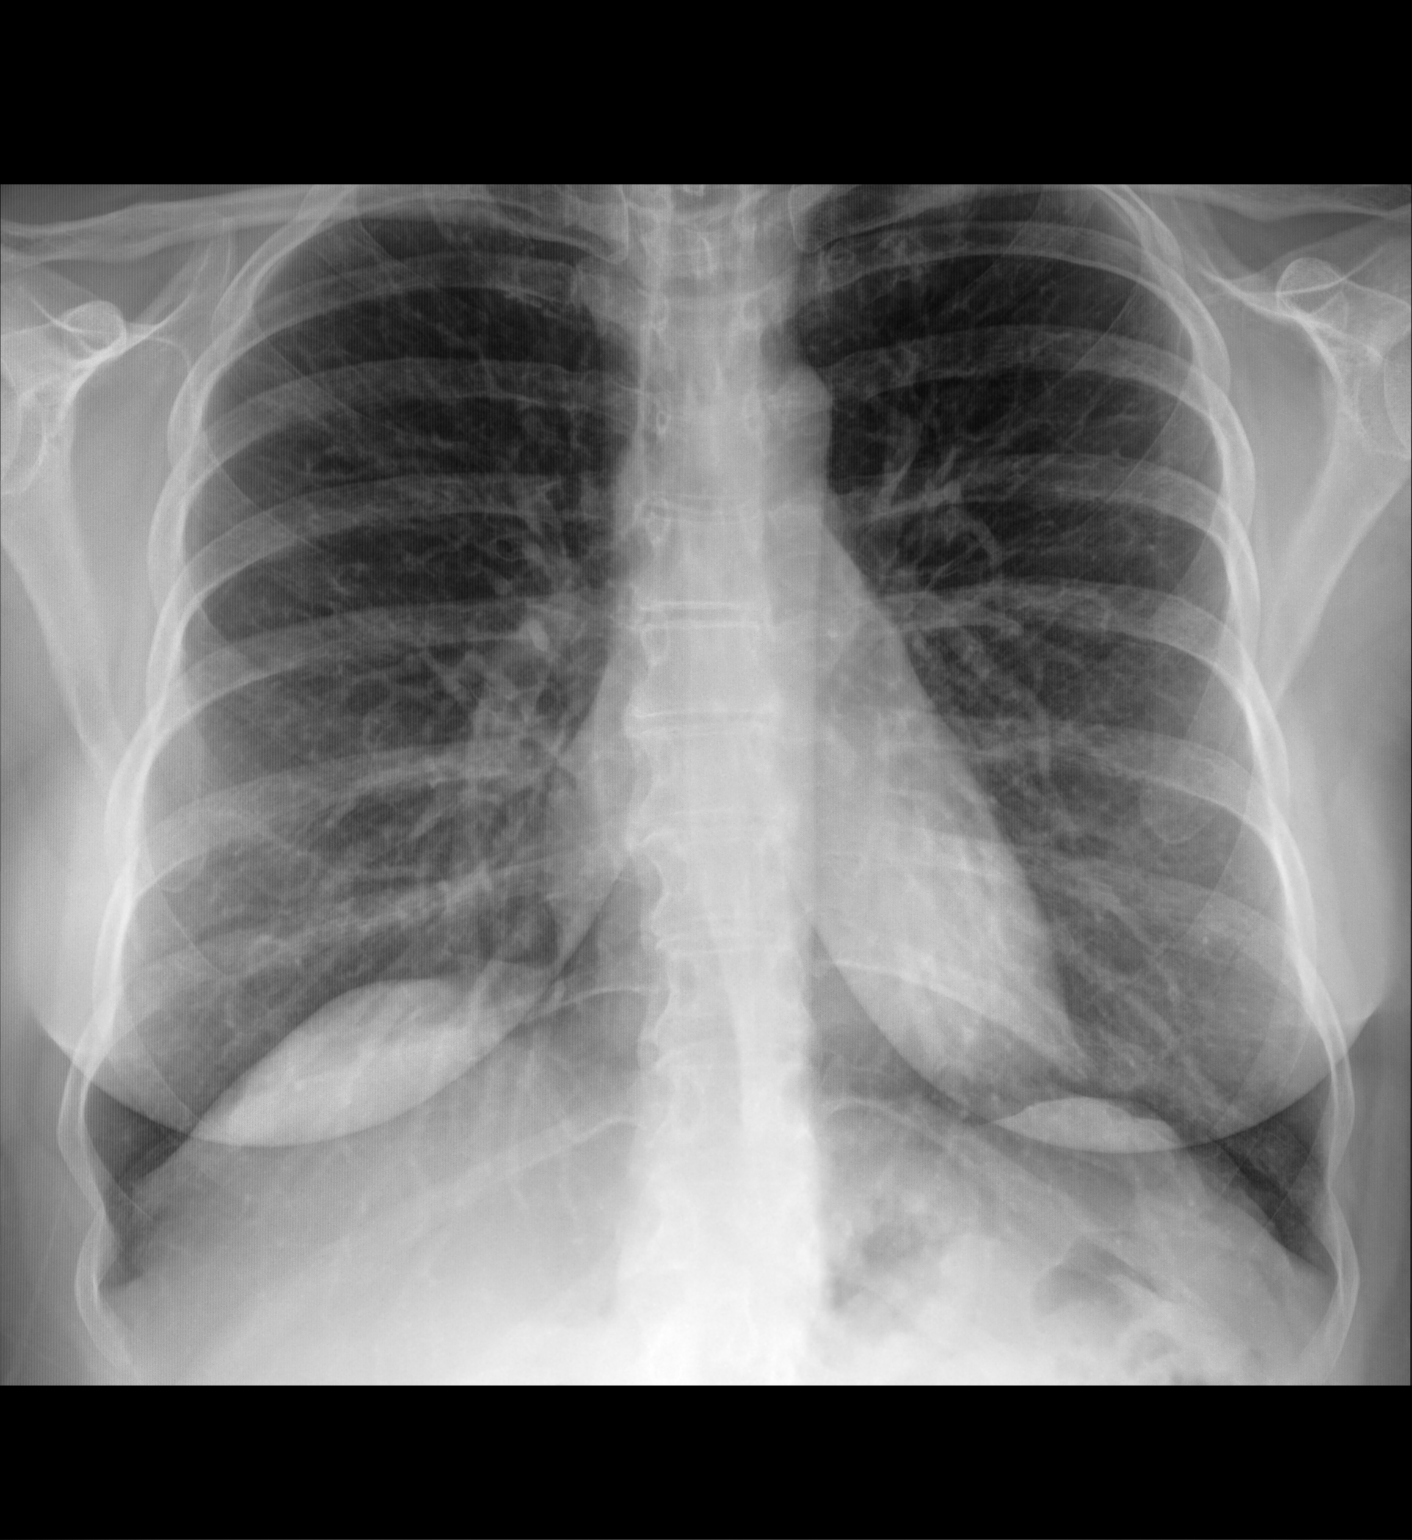

[chest lat]
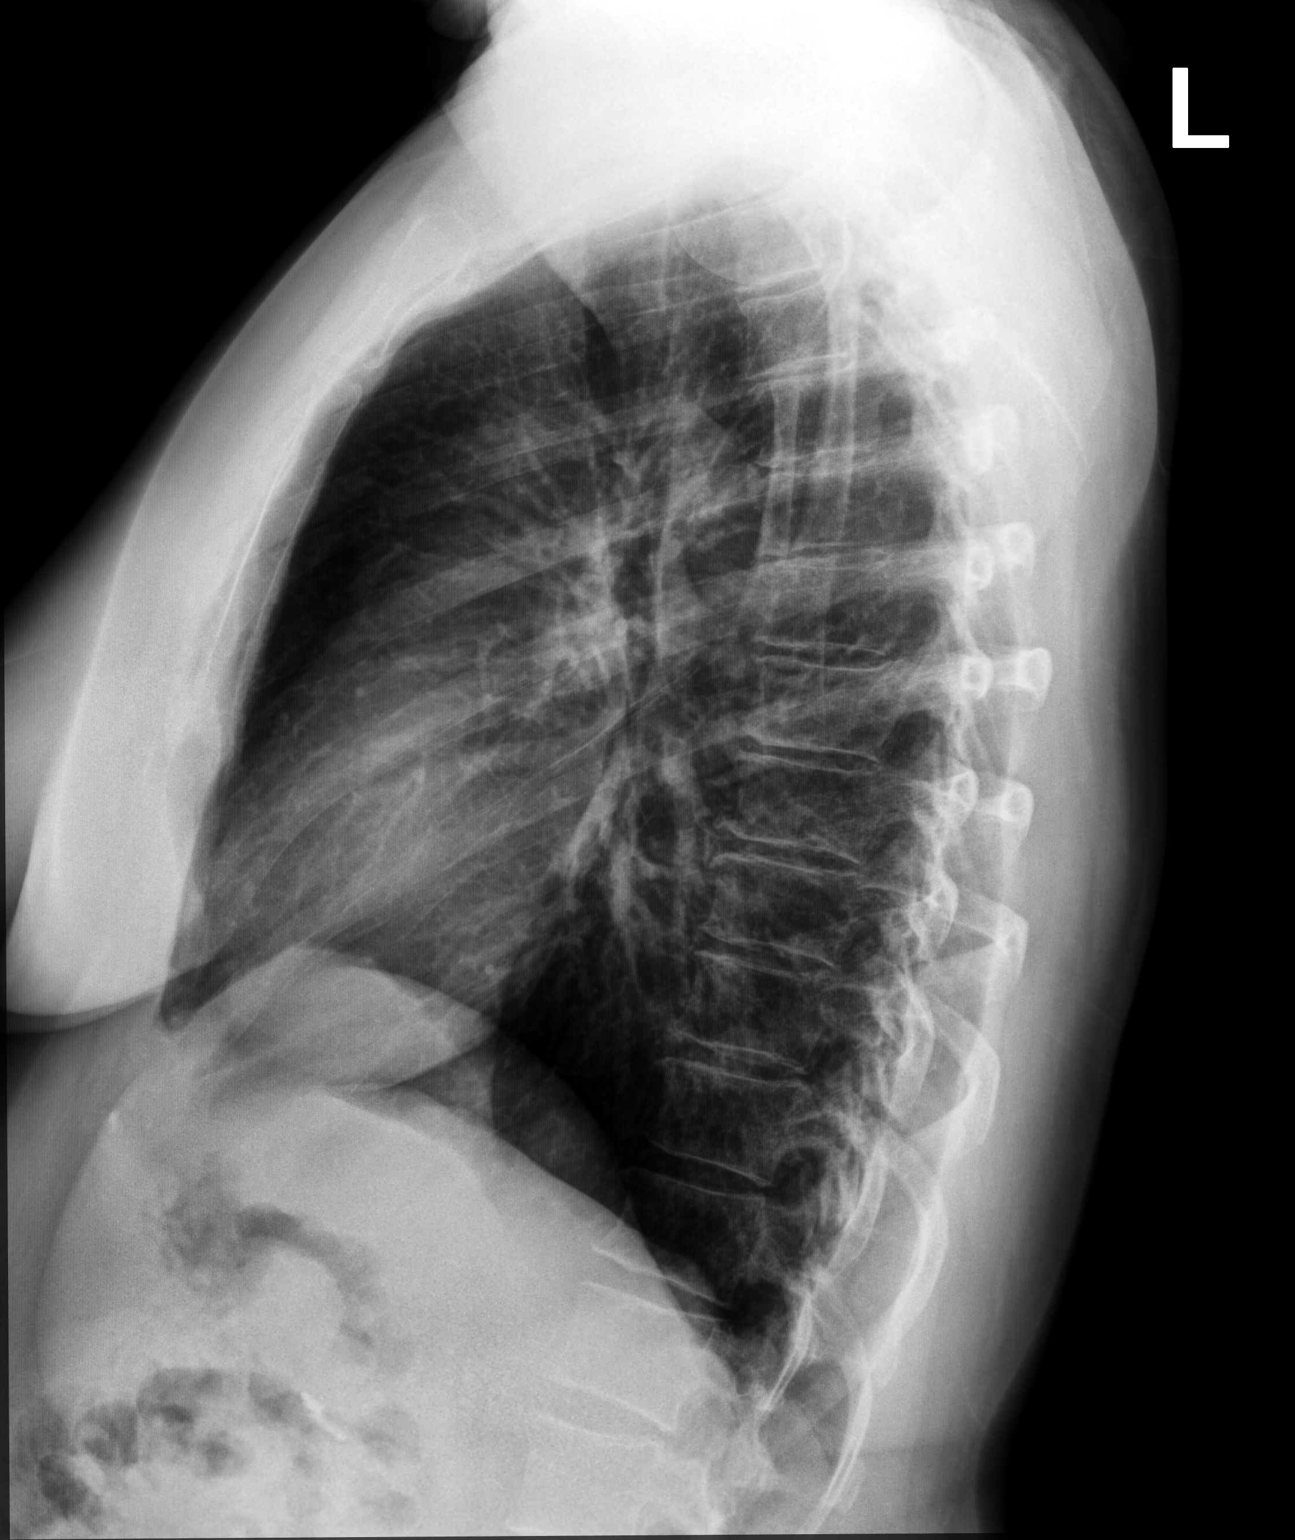

[2 of 2 positions shown; findings below may reference images not displayed]

FINDINGS: The heart size and mediastinal contours are within normal limits.
Both lungs are clear. The visualized skeletal structures are
unremarkable. Radiopaque surgical clips are seen within the right
upper quadrant.
IMPRESSION: No active cardiopulmonary disease.

## 2021-10-10 ENCOUNTER — Telehealth: Payer: Self-pay | Admitting: *Deleted

## 2021-10-10 ENCOUNTER — Other Ambulatory Visit (HOSPITAL_COMMUNITY)
Admission: RE | Admit: 2021-10-10 | Discharge: 2021-10-10 | Disposition: A | Discharge status: Home / Self Care | Payer: No Typology Code available for payment source | Source: Ambulatory Visit | Attending: Family Medicine | Admitting: Family Medicine

## 2021-10-10 ENCOUNTER — Ambulatory Visit: Payer: No Typology Code available for payment source | Admitting: Family Medicine

## 2021-10-10 ENCOUNTER — Encounter: Payer: Self-pay | Admitting: Family Medicine

## 2021-10-10 ENCOUNTER — Other Ambulatory Visit: Payer: Self-pay

## 2021-10-10 VITALS — BP 98/70 | HR 59 | Temp 98.6°F | Ht 67.0 in | Wt 178.5 lb

## 2021-10-10 DIAGNOSIS — E785 Hyperlipidemia, unspecified: Secondary | ICD-10-CM

## 2021-10-10 DIAGNOSIS — I10 Essential (primary) hypertension: Secondary | ICD-10-CM

## 2021-10-10 DIAGNOSIS — Z23 Encounter for immunization: Secondary | ICD-10-CM | POA: Diagnosis not present

## 2021-10-10 DIAGNOSIS — Z124 Encounter for screening for malignant neoplasm of cervix: Secondary | ICD-10-CM

## 2021-10-10 DIAGNOSIS — N3946 Mixed incontinence: Secondary | ICD-10-CM

## 2021-10-10 DIAGNOSIS — Z0000 Encounter for general adult medical examination without abnormal findings: Secondary | ICD-10-CM | POA: Diagnosis not present

## 2021-10-10 LAB — CBC WITH DIFFERENTIAL/PLATELET
Basophils Absolute: 0 10*3/uL (ref 0.0–0.1)
Basophils Relative: 0.7 % (ref 0.0–3.0)
Eosinophils Absolute: 0.1 10*3/uL (ref 0.0–0.7)
Eosinophils Relative: 1.9 % (ref 0.0–5.0)
HCT: 40.8 % (ref 36.0–46.0)
Hemoglobin: 13.8 g/dL (ref 12.0–15.0)
Lymphocytes Relative: 39.7 % (ref 12.0–46.0)
Lymphs Abs: 2.1 10*3/uL (ref 0.7–4.0)
MCHC: 33.7 g/dL (ref 30.0–36.0)
MCV: 96 fl (ref 78.0–100.0)
Monocytes Absolute: 0.5 10*3/uL (ref 0.1–1.0)
Monocytes Relative: 9.1 % (ref 3.0–12.0)
Neutro Abs: 2.6 10*3/uL (ref 1.4–7.7)
Neutrophils Relative %: 48.6 % (ref 43.0–77.0)
Platelets: 257 10*3/uL (ref 150.0–400.0)
RBC: 4.25 Mil/uL (ref 3.87–5.11)
RDW: 13.5 % (ref 11.5–15.5)
WBC: 5.3 10*3/uL (ref 4.0–10.5)

## 2021-10-10 LAB — LIPID PANEL
Cholesterol: 236 mg/dL — ABNORMAL HIGH (ref 0–200)
HDL: 57.1 mg/dL (ref >39.00)
LDL Cholesterol: 152 mg/dL — ABNORMAL HIGH (ref 0–99)
NonHDL: 179.07
Total CHOL/HDL Ratio: 4
Triglycerides: 137 mg/dL (ref 0.0–149.0)
VLDL: 27.4 mg/dL (ref 0.0–40.0)

## 2021-10-10 LAB — COMPREHENSIVE METABOLIC PANEL
ALT: 22 U/L (ref 0–35)
AST: 25 U/L (ref 0–37)
Albumin: 4.5 g/dL (ref 3.5–5.2)
Alkaline Phosphatase: 72 U/L (ref 39–117)
BUN: 13 mg/dL (ref 6–23)
CO2: 28 mEq/L (ref 19–32)
Calcium: 10.5 mg/dL (ref 8.4–10.5)
Chloride: 103 mEq/L (ref 96–112)
Creatinine, Ser: 0.95 mg/dL (ref 0.40–1.20)
GFR: 66.39 mL/min (ref >60.00)
Glucose, Bld: 94 mg/dL (ref 70–99)
Potassium: 4.5 mEq/L (ref 3.5–5.1)
Sodium: 138 mEq/L (ref 135–145)
Total Bilirubin: 0.6 mg/dL (ref 0.2–1.2)
Total Protein: 7.3 g/dL (ref 6.0–8.3)

## 2021-10-10 MED ORDER — BACTROBAN NASAL 2 % NA OINT: 1.0000 "application " | TOPICAL_OINTMENT | Freq: Two times a day (BID) | NASAL | 0 refills | Status: DC

## 2021-10-10 NOTE — Telephone Encounter (Signed)
Per Sharl Ma at [facility] [phone]) they do not have a provider that specifically performs this as their physical therapists do this.  Patrice stated a referral is needed to a provider in the office first for a new patient and if the provider feels this is needed, they will refer the patient.  Message sent to PCP.

## 2021-10-10 NOTE — Telephone Encounter (Signed)
-----   Message from Caren Macadam, MD sent at 10/10/2021  9:57 AM EST ----- I want to refer her for pelvic floor therapy, but I don't remember name of provider that does this. I believe that [facility] has sent some of my patients- can you get a name from them and put in referral for pelvic floor therapy for dx of incontinence?

## 2021-10-10 NOTE — Progress Notes (Signed)
Belinda Maxwell DOB: April 26, 1966 Encounter date: 10/10/2021  This is a 55 y.o. female who presents for complete physical   History of present illness/Additional concerns:  Hypertension: Lisinopril 10mg  daily. At home when she has checked it was up more. Increased back up to 10mg . Has been on this dose for awhile now; maybe since fall.  Follows with dermatology but not regularly. Last visit was 2019 or 2020.  Cologuard - 05/2019 Mammogram completed at [facility].  She did get a call back on her screening mammogram on 12/13/2020, but follow-up diagnostic on 12/20/2020 demonstrated benign findings. Last Pap was 05/2016 done through [facility].  Normal Pap, negative HPV.  Takes meloxicam just if needed for back/neck. Worse with things like longer car trip. Follows with chiropractor which helps. Has felt better since adjustment. Does get numbness in her hands; but this is more positional (ie sleeping, reading in bed). Fish oil and turmeric helps.   Does have some incontinence; more urge incontinence. Some stress incontinence.    Past Medical History:  Diagnosis Date   Acute lateral meniscal injury of right knee 06/11/2016   Headache 02/03/2008   Qualifier: Diagnosis of  By: Leanne Chang MD, Bruce  Menstrual related    Headache(784.0)    Hearing loss    Hemorrhoid 01/11/2016   -sees surgeon in [place] flashes 01/11/2016   -seeing gynecologist    Hyperlipemia 01/11/2016   Hypertension 04/19/2010   Qualifier: Diagnosis of  By: Leanne Chang MD, Bruce      Lumbar radiculopathy, right 08/06/2016   Pre-eclampsia    Past Surgical History:  Procedure Laterality Date   CHOLECYSTECTOMY     hemorrhoid banding     Allergies  Allergen Reactions   Sulfamethoxazole Hives   Current Meds  Medication Sig   mupirocin nasal ointment (BACTROBAN NASAL) 2 % Place 1 application into the nose 2 (two) times daily. Use one-half of tube in each nostril twice daily for five (5) days. After application, press sides of  nose together and gently massage.   Social History   Tobacco Use   Smoking status: Never   Smokeless tobacco: Never  Substance Use Topics   Alcohol use: No   Family History  Problem Relation Age of Onset   Hyperlipidemia Mother    Hypertension Mother    Migraines Mother    Glaucoma Mother    Cataracts Mother    Macular degeneration Mother    Diabetes Mother        prediabetic   Kidney disease Mother    Obesity Mother    Breast cancer Mother 69       mastectomy; removed with surgery   Hyperlipidemia Father    Hypertension Father    Heart disease Father 33       AFIB   Obesity Father    Migraines Sister    Stroke Sister 67       secondary to PFO   Hypothyroidism Sister    Migraines Son    Obesity Maternal Grandmother    Arthritis Maternal Grandmother    Alcohol abuse Maternal Grandfather    Obesity Paternal Grandmother    Diabetes Paternal Grandmother    Gallbladder disease Paternal Grandfather      Review of Systems  Constitutional:  Negative for activity change, appetite change, chills, fatigue, fever and unexpected weight change.  HENT:  Negative for congestion, ear pain, hearing loss, sinus pressure, sinus pain, sore throat and trouble swallowing.   Eyes:  Negative for  pain and visual disturbance.  Respiratory:  Negative for cough, chest tightness, shortness of breath and wheezing.   Cardiovascular:  Negative for chest pain, palpitations and leg swelling.  Gastrointestinal:  Negative for abdominal pain, blood in stool, constipation, diarrhea, nausea and vomiting.  Genitourinary:  Positive for urgency (notes if she is out for walk and comes in to use bathroom; then hard to get there in time). Negative for difficulty urinating, dysuria and menstrual problem.  Musculoskeletal:  Negative for arthralgias and back pain.  Skin:  Negative for rash.  Neurological:  Negative for dizziness, weakness, numbness and headaches.  Hematological:  Negative for adenopathy. Does  not bruise/bleed easily.  Psychiatric/Behavioral:  Negative for sleep disturbance and suicidal ideas. The patient is not nervous/anxious.    CBC:  Lab Results  Component Value Date   WBC 5.3 10/10/2021   HGB 13.8 10/10/2021   HCT 40.8 10/10/2021   MCH 33.3 (H) 08/11/2020   MCHC 33.7 10/10/2021   RDW 13.5 10/10/2021   PLT 257.0 10/10/2021   MPV 9.6 08/11/2020   CMP: Lab Results  Component Value Date   NA 138 10/10/2021   K 4.5 10/10/2021   CL 103 10/10/2021   CO2 28 10/10/2021   ANIONGAP 8 06/07/2020   GLUCOSE 94 10/10/2021   BUN 13 10/10/2021   CREATININE 0.95 10/10/2021   CREATININE 1.12 (H) 08/11/2020   GFRAA >60 06/07/2020   CALCIUM 10.5 10/10/2021   PROT 7.3 10/10/2021   BILITOT 0.6 10/10/2021   ALKPHOS 72 10/10/2021   ALT 22 10/10/2021   AST 25 10/10/2021   LIPID: Lab Results  Component Value Date   CHOL 236 (H) 10/10/2021   TRIG 137.0 10/10/2021   HDL 57.10 10/10/2021   LDLCALC 152 (H) 10/10/2021   LDLCALC 101 (H) 08/11/2020    Objective:  BP 98/70 (BP Location: Left Arm, Patient Position: Sitting, Cuff Size: Large)    Pulse (!) 59    Temp 98.6 F (37 C) (Oral)    Ht 5\' 7"  (1.702 m)    Wt 178 lb 8 oz (81 kg)    SpO2 98%    BMI 27.96 kg/m   Weight: 178 lb 8 oz (81 kg)   BP Readings from Last 3 Encounters:  10/10/21 98/70  08/31/21 122/86  08/11/20 108/80   Wt Readings from Last 3 Encounters:  10/10/21 178 lb 8 oz (81 kg)  08/31/21 172 lb (78 kg)  08/11/20 184 lb 4.8 oz (83.6 kg)    Physical Exam Constitutional:      General: She is not in acute distress.    Appearance: She is well-developed.  HENT:     Head: Normocephalic and atraumatic.     Right Ear: External ear normal.     Left Ear: External ear normal.     Mouth/Throat:     Pharynx: No oropharyngeal exudate.  Eyes:     Conjunctiva/sclera: Conjunctivae normal.     Pupils: Pupils are equal, round, and reactive to light.  Neck:     Thyroid: No thyromegaly.  Cardiovascular:     Rate  and Rhythm: Normal rate and regular rhythm.     Heart sounds: Normal heart sounds. No murmur heard.   No friction rub. No gallop.  Pulmonary:     Effort: Pulmonary effort is normal.     Breath sounds: Normal breath sounds.  Abdominal:     General: Bowel sounds are normal. There is no distension.     Palpations: Abdomen is soft. There  is no mass.     Tenderness: There is no abdominal tenderness. There is no guarding.     Hernia: No hernia is present.  Genitourinary:    General: Normal vulva.     Exam position: Supine.     Urethra: No prolapse, urethral swelling or urethral lesion.     Vagina: Normal.     Cervix: Normal.     Uterus: Normal.      Adnexa: Right adnexa normal and left adnexa normal.     Rectum: No external hemorrhoid.     Comments: Low grade cystocele Musculoskeletal:        General: No tenderness or deformity. Normal range of motion.     Cervical back: Normal range of motion and neck supple.  Lymphadenopathy:     Cervical: No cervical adenopathy.  Skin:    General: Skin is warm and dry.     Findings: No rash.  Neurological:     Mental Status: She is alert and oriented to person, place, and time.     Deep Tendon Reflexes: Reflexes normal.     Reflex Scores:      Tricep reflexes are 2+ on the right side and 2+ on the left side.      Bicep reflexes are 2+ on the right side and 2+ on the left side.      Brachioradialis reflexes are 2+ on the right side and 2+ on the left side.      Patellar reflexes are 2+ on the right side and 2+ on the left side. Psychiatric:        Speech: Speech normal.        Behavior: Behavior normal.        Thought Content: Thought content normal.    Assessment/Plan: There are no preventive care reminders to display for this patient.  Health Maintenance reviewed.  1. Preventative health care Keep up with regular exercise/activity.  - Tdap vaccine greater than or equal to 7yo IM  2. Primary hypertension Well controlled. Continue  with lisinopril 10mg  daily.  - CBC with Differential/Platelet; Future - CBC with Differential/Platelet  3. Hyperlipidemia, unspecified hyperlipidemia type - Comprehensive metabolic panel; Future - Lipid panel; Future - Lipid panel - Comprehensive metabolic panel  4. Mixed stress and urge urinary incontinence Discussed trial of pelvic floor rehab; she does have mild cystocele.  Urinalysis with Culture Reflex - Ambulatory referral to Physical Therapy  5. Cervical cancer screening - PAP [[place]]   Return in about 6 months (around 04/10/2022) for Chronic condition visit.  Micheline Rough, MD

## 2021-10-10 NOTE — Telephone Encounter (Signed)
Noted[facility] through [facility] does this as well so we can go that route.

## 2021-10-12 LAB — URINALYSIS W MICROSCOPIC + REFLEX CULTURE
Bacteria, UA: NONE SEEN /HPF
Bilirubin Urine: NEGATIVE
Glucose, UA: NEGATIVE
Hgb urine dipstick: NEGATIVE
Hyaline Cast: NONE SEEN /LPF
Ketones, ur: NEGATIVE
Nitrites, Initial: NEGATIVE
Protein, ur: NEGATIVE
RBC / HPF: NONE SEEN /HPF (ref 0–2)
Specific Gravity, Urine: 1.003 (ref 1.001–1.035)
Squamous Epithelial / HPF: NONE SEEN /HPF (ref <=5)
WBC, UA: NONE SEEN /HPF (ref 0–5)
pH: 7 (ref 5.0–8.0)

## 2021-10-12 LAB — URINE CULTURE
MICRO NUMBER:: 12805758
Result:: NO GROWTH
SPECIMEN QUALITY:: ADEQUATE

## 2021-10-12 LAB — CULTURE INDICATED

## 2021-10-17 LAB — CYTOLOGY - PAP
Comment: NEGATIVE
High risk HPV: NEGATIVE

## 2021-10-19 ENCOUNTER — Encounter: Payer: Self-pay | Admitting: *Deleted

## 2021-10-24 ENCOUNTER — Ambulatory Visit: Payer: No Typology Code available for payment source | Attending: Family Medicine | Admitting: Physical Therapy

## 2021-10-24 ENCOUNTER — Encounter: Payer: Self-pay | Admitting: Physical Therapy

## 2021-10-24 ENCOUNTER — Other Ambulatory Visit: Payer: Self-pay

## 2021-10-24 DIAGNOSIS — R279 Unspecified lack of coordination: Secondary | ICD-10-CM | POA: Diagnosis not present

## 2021-10-24 DIAGNOSIS — M6281 Muscle weakness (generalized): Secondary | ICD-10-CM | POA: Diagnosis not present

## 2021-10-24 DIAGNOSIS — N3946 Mixed incontinence: Secondary | ICD-10-CM | POA: Insufficient documentation

## 2021-10-24 NOTE — Therapy (Addendum)
[facility] @ [place], [place], [address] Phone: [phone]   Fax:  [phone]  Physical Therapy Evaluation  Patient Details  Name: Belinda Maxwell MRN: [id number] Date of Birth: 09-02-1966 Referring Provider (PT): Caren Macadam, MD   Encounter Date: 10/24/2021   PT End of Session - 10/24/21 0845     Visit Number 1    Date for PT Re-Evaluation 01/22/22    Authorization Type [employer]    PT Start Time 0800    PT Stop Time 0844    PT Time Calculation (min) 44 min    Activity Tolerance Patient tolerated treatment well    Behavior During Therapy [facility] for tasks assessed/performed             Past Medical History:  Diagnosis Date   Acute lateral meniscal injury of right knee 06/11/2016   Headache 02/03/2008   Qualifier: Diagnosis of  By: Leanne Chang MD, Bruce  Menstrual related    Headache(784.0)    Hearing loss    Hemorrhoid 01/11/2016   -sees surgeon in [place] flashes 01/11/2016   -seeing gynecologist    Hyperlipemia 01/11/2016   Hypertension 04/19/2010   Qualifier: Diagnosis of  By: Leanne Chang MD, Bruce      Lumbar radiculopathy, right 08/06/2016   Pre-eclampsia     Past Surgical History:  Procedure Laterality Date   CHOLECYSTECTOMY     hemorrhoid banding      There were no vitals filed for this visit.    Subjective Assessment - 10/24/21 0803     Subjective Pt reports she struggles with urinary incontinence with strong urges, and sometimes with seeing bathroom she has to use it immediately or will have leakage and sometimes doesn't know when she leaks. Pt doesn't wear pads usually but will with long car rides.    Pertinent History 2 childrend vaginal and episiotomy with the first    How long can you sit comfortably? no limits    How long can you stand comfortably? no limits    How long can you walk comfortably? no limits    Patient Stated Goals to have leak    Currently in Pain? No/denies                 [facility] PT Assessment - 10/24/21 0001       Assessment   Medical Diagnosis N39.46 (ICD-10-CM) - Mixed stress and urge urinary incontinence    Referring Provider (PT) Caren Macadam, MD    Onset Date/Surgical Date --   years   Prior Therapy not for PFPT      Precautions   Precautions None      Restrictions   Weight Bearing Restrictions No      Balance Screen   Has the patient fallen in the past 6 months Yes    How many times? 2 -tripped over a rug and working outside    Has the patient had a decrease in activity level because of a fear of falling?  No    Is the patient reluctant to leave their home because of a fear of falling?  No      Home Environment   Living Environment Private residence    Living Arrangements Spouse/significant other      Prior Function   Level of Independence Independent      Cognition   Overall Cognitive Status Within Functional Limits for tasks assessed  Sensation   Light Touch Appears Intact      Coordination   Gross Motor Movements are Fluid and Coordinated Yes    Fine Motor Movements are Fluid and Coordinated Yes      Posture/Postural Control   Posture/Postural Control No significant limitations      ROM / Strength   AROM / PROM / Strength AROM;Strength      AROM   Overall AROM Comments thoracic and lumbar spine limited by 25% in all directions and pt reports feeling stiffness with all movement      Strength   Overall Strength Comments bil hips grossly 3+/5 with exception of bil flexion 4/5      Flexibility   Soft Tissue Assessment /Muscle Length yes   bil hamstrings and adductors limited by 25%     Palpation   Spinal mobility decreased rib mobility with breathing mechanics    Palpation comment no TTP                        Objective measurements completed on examination: See above findings.     Pelvic Floor Special Questions - 10/24/21 0001     Prior Pelvic/Prostate Exam Yes    normal however this most recent had "something" but she doing a repeat in 2/23   Are you Pregnant or attempting pregnancy? No    Prior Pregnancies Yes    Number of Pregnancies 2    Number of Vaginal Deliveries 2    Any difficulty with labor and deliveries No    Episiotomy Performed Yes   first   Currently Sexually Active No   lack of desire   History of sexually transmitted disease No    Marinoff Scale no problems    Urinary Leakage Yes    How often daily but unsure when somtimes    Pad use no    Activities that cause leaking With strong urge;Coughing;Sneezing;Laughing;Walking;Exercising    Urinary urgency Yes    Urinary frequency yes- every 2 hours, sometimes every 1 hour    Fecal incontinence No    Fluid intake tries to get more water    Caffeine beverages yes: 3-4 cups    Falling out feeling (prolapse) No    External Perineal Exam Redness at labia majora    Pelvic Floor Internal Exam patient identified and patient confirms consent for PT to perform internal soft tissue work and muscle strength and integrity assessmen    Exam Type Vaginal    Palpation mild TTP at Rt side of pelvis throughout but slightly increased tension as well    Strength good squeeze, good lift, able to hold agaisnt strong resistance    Strength # of reps 6    Strength # of seconds 8    Tone slightly increased at Rt side                       PT Education - 10/24/21 0844     Education Details Pt educated on exam findings, POC, HEP, bladder irritants, and urge drill    Person(s) Educated Patient    Methods Demonstration;Explanation;Tactile cues;Verbal cues;Handout    Comprehension Returned demonstration;Verbalized understanding              PT Short Term Goals - 10/24/21 0855       PT SHORT TERM GOAL #1   Title pt to be I with HEP    Time 5    Period Weeks  Status New    Target Date 11/28/21      PT SHORT TERM GOAL #2   Title pt to demonstrate at least 4/5 pelvic floor  strength with ability to hold for 10s and fully relax after, and completed improved W10 quick flicks at consistent timing for improved urinary leakage symptoms    Time 5    Period Weeks    Status New    Target Date 11/28/21      PT SHORT TERM GOAL #3   Title pt to report no more than 1 urinary leak per day for improved symptoms and QOL.    Time 5    Period Weeks    Status New    Target Date 11/28/21      PT SHORT TERM GOAL #4   Title pt to report at least 2.5 hours between bladder voids to improve QOL with community outings without leakage.    Time 5    Period Weeks    Status New    Target Date 11/28/21               PT Long Term Goals - 10/24/21 0857       PT LONG TERM GOAL #1   Title Pt to be I with advanced HEP    Time 3    Period Months    Status New    Target Date 01/22/22      PT LONG TERM GOAL #2   Title pt to demonstrate at least 5/5 pelvic floor strength with ability to hold for 10s and fully relax after, and completed improved U72 quick flicks at consistent timing for improved urinary leakage symptoms    Time 3    Period Months    Status New    Target Date 01/22/22      PT LONG TERM GOAL #3   Title pt to report no more than 1 urinary leak per week for improved symptoms and QOL.    Time 3    Period Months    Status New    Target Date 01/22/22      PT LONG TERM GOAL #4   Title pt to report at least 3.5 hours between bladder voids to improve QOL with community outings without leakage.    Time 3    Period Months    Status New    Target Date 01/22/22      PT LONG TERM GOAL #5   Title pt to demonstrate at least 5/5 bil hip strength in all directions for improved pelvic stability and support for functional tasks without leakage.    Time 3    Period Months    Status New    Target Date 01/22/22                    Plan - 10/24/21 0845     Clinical Impression Statement Pt is 56yo female presenting to clinic reporting several year h/o  urinary incontinence with strong urgency and with stressors of sneezing, laughing and coughing and sometimes walking or exercising however pt reports she doesn't always know when she has leakage. Pt denies any complaints of constipation (unless she has a poor diet per pt), straining, pain, or fecal smearing. Pt found to have decreased mobility in thoracic and lumbar spine in all directions, weakness in bil hips and decreased mobility at bil hips. Pt denied all pain externally and reported just feeling stiff. Pt did have decreased rb mobility with breathing mechanics  with expansion. Pt consented to internal vaginal pelvic floor assessment this date, found to have mild tightness at Rt side of pelvis with mild TTP throughout and nothing to report about Lt side of pelvis. Pt demonstrated decreased strength, endurnace, and coordination of pelvic floor with assessment and reported she feels like she can't feel when is contracting or relaxing well. Pt given handouts for HEP, bladder irritants, urge drill and all went over. All pt questions addressed and she denied additional questions at end of session. Pt motivated to participate in PT and improve symptoms, and would benefit from additional PT to further address deficits with urinary leakage.    Personal Factors and Comorbidities Time since onset of injury/illness/exacerbation;Comorbidity 2;Fitness    Comorbidities x2 vaginal births    Examination-Activity Limitations Continence;Squat;Locomotion Level;Toileting    Examination-Participation Restrictions Community Activity    Stability/Clinical Decision Making Stable/Uncomplicated    Clinical Decision Making Low    Rehab Potential Good    PT Frequency 1x / week    PT Duration Other (comment)   10 visits   PT Treatment/Interventions ADLs/Self Care Home Management;Functional mobility training;Therapeutic activities;Therapeutic exercise;Neuromuscular re-education;Manual techniques;Patient/family education;Scar  mobilization;Passive range of motion;Energy conservation    PT Next Visit Plan go over all handouts, strengthening and stretching of hips and spine with coordination of PF/core/breathing    PT Home Exercise Plan [id number]    Consulted and Agree with Plan of Care Patient             Patient will benefit from skilled therapeutic intervention in order to improve the following deficits and impairments:  Decreased coordination, Decreased endurance, Increased fascial restricitons, Improper body mechanics, Impaired flexibility, Decreased strength, Decreased mobility  Visit Diagnosis: Muscle weakness (generalized) - Plan: PT plan of care cert/re-cert  Lack of coordination - Plan: PT plan of care cert/re-cert  Mixed incontinence - Plan: PT plan of care cert/re-cert     Problem List Patient Active Problem List   Diagnosis Date Noted   Acute hypoxemic respiratory failure due to COVID-19 ([place]) 06/03/2020   [place] 11/11/2017   Arthritis of carpometacarpal (CMC) joints of both thumbs 11/07/2016   Piriformis syndrome of right side 07/09/2016   Hot flashes 01/11/2016   Hemorrhoid 01/11/2016   Hearing loss 01/11/2016   Hyperlipemia 01/11/2016   Hypertension 04/19/2010    No emotional/communication barriers or cognitive limitation. Patient is motivated to learn. Patient understands and agrees with treatment goals and plan. PT explains patient will be examined in standing, sitting, and lying down to see how their muscles and joints work. When they are ready, they will be asked to remove their underwear so PT can examine their perineum. The patient is also given the option of providing their own chaperone as one is not provided in our facility. The patient also has the right and is explained the right to defer or refuse any part of the evaluation or treatment including the internal exam. With the patient's consent, PT will use one gloved finger to gently assess the muscles of the pelvic floor, seeing  how well it contracts and relaxes and if there is muscle symmetry. After, the patient will get dressed and PT and patient will discuss exam findings and plan of care. PT and patient discuss plan of care, schedule, attendance policy and HEP activities.     Stacy Gardner, PT, DPT 01/11/239:14 AM   [facility] @ [place], [place], [address] Phone: [phone]   Fax:  [phone]  Name: Belinda Maxwell MRN: [id number] Date of Birth: Apr 26, 1966

## 2021-10-24 NOTE — Patient Instructions (Signed)
Bladder Irritants  Certain foods and beverages can be irritating to the bladder.  Avoiding these irritants may decrease your symptoms of urinary urgency, frequency or bladder pain.  Even reducing your intake can help with your symptoms.  Not everyone is sensitive to all bladder irritants, so you may consider focusing on one irritant at a time, removing or reducing your intake of that irritant for 7-10 days to see if this change helps your symptoms.  Water intake is also very important.  Below is a list of bladder irritants.  Drinks: alcohol, carbonated beverages, caffeinated beverages such as coffee and tea, drinks with artificial sweeteners, citrus juices, apple juice, tomato juice  Foods: tomatoes and tomato based foods, spicy food, sugar and artificial sweeteners, vinegar, chocolate, raw onion, apples, citrus fruits, pineapple, cranberries, tomatoes, strawberries, plums, peaches, cantaloupe  Other: acidic urine (too concentrated) - see water intake info below  Substitutes you can try that are NOT irritating to the bladder: cooked onion, pears, papayas, sun-brewed decaf teas, watermelons, non-citrus herbal teas, apricots, kava and low-acid instant drinks (Postum).    WATER INTAKE: Remember to drink lots of water (aim for fluid intake of half your body weight with 2/3 of fluids being water).  You may be limiting fluids due to fear of leakage, but this can actually worsen urgency symptoms due to highly concentrated urine.  Water helps balance the pH of your urine so it doesn't become too acidic - acidic urine is a bladder irritant!   Urge Incontinence  Ideal urination frequency is every 2-4 wakeful hours, which equates to 5-8 times within a 24-hour period.   Urge incontinence is leakage that occurs when the bladder muscle contracts, creating a sudden need to go before getting to the bathroom.   Going too often when your bladder isn't actually full can disrupt the body's automatic signals to  store and hold urine longer, which will increase urgency/frequency.  In this case, the bladder "is running the show" and strategies can be learned to retrain this pattern.   One should be able to control the first urge to urinate, at around 150mL.  The bladder can hold up to a "grande latte," or 400mL. To help you gain control, practice the Urge Drill below when urgency strikes.  This drill will help retrain your bladder signals and allow you to store and hold urine longer.  The overall goal is to stretch out your time between voids to reach a more manageable voiding schedule.    Practice your "quick flicks" often throughout the day (each waking hour) even when you don't need feel the urge to go.  This will help strengthen your pelvic floor muscles, making them more effective in controlling leakage.  Urge Drill  When you feel an urge to go, follow these steps to regain control: Stop what you are doing and be still Take one deep breath, directing your air into your abdomen Think an affirming thought, such as "I've got this." Do 5 quick flicks of your pelvic floor Walk with control to the bathroom to void, or delay voiding   THE KNACK  The Knack is a strategy you may use to help to reduce or prevent leakage or passing of urine, gas or feces during an activity that causes downward force on the pelvic floor muscles.    Activities that can cause downward pressure on the pelvic floor muscles include coughing, sneezing, laughing, bending, lifting, and transitioning from different body positions such as from laying down to sitting   up and sitting to standing.  To perform The Knack, consciously squeeze and lift your pelvic floor muscles to perform a strong, well-timed pelvic muscle contraction BEFORE AND DURING these activities above.  As your contraction gets more coordinated and your muscles get stronger, you will become more effective in controlling your experience of incontinence or gas passing during  these activities.    

## 2021-11-21 ENCOUNTER — Encounter: Payer: Self-pay | Admitting: Physical Therapy

## 2021-11-21 ENCOUNTER — Other Ambulatory Visit: Payer: Self-pay

## 2021-11-21 ENCOUNTER — Ambulatory Visit: Payer: No Typology Code available for payment source | Attending: Family Medicine | Admitting: Physical Therapy

## 2021-11-21 DIAGNOSIS — R278 Other lack of coordination: Secondary | ICD-10-CM | POA: Insufficient documentation

## 2021-11-21 DIAGNOSIS — M6281 Muscle weakness (generalized): Secondary | ICD-10-CM | POA: Diagnosis not present

## 2021-11-21 NOTE — Patient Instructions (Signed)
Urge Incontinence  Ideal urination frequency is every 2-4 wakeful hours, which equates to 5-8 times within a 24-hour period.   Urge incontinence is leakage that occurs when the bladder muscle contracts, creating a sudden need to go before getting to the bathroom.   Going too often when your bladder isn't actually full can disrupt the body's automatic signals to store and hold urine longer, which will increase urgency/frequency.  In this case, the bladder "is running the show" and strategies can be learned to retrain this pattern.   One should be able to control the first urge to urinate, at around 150mL.  The bladder can hold up to a "grande latte," or 400mL. To help you gain control, practice the Urge Drill below when urgency strikes.  This drill will help retrain your bladder signals and allow you to store and hold urine longer.  The overall goal is to stretch out your time between voids to reach a more manageable voiding schedule.    Practice your "quick flicks" often throughout the day (each waking hour) even when you don't need feel the urge to go.  This will help strengthen your pelvic floor muscles, making them more effective in controlling leakage.  Urge Drill  When you feel an urge to go, follow these steps to regain control: Stop what you are doing and be still Take one deep breath, directing your air into your abdomen Think an affirming thought, such as "I've got this." Do 5 quick flicks of your pelvic floor Walk with control to the bathroom to void, or delay voiding   Relaxation Exercises with the Urge to Void   When you experience an urge to void:  FIRST  Stop and stand very still    Sit down if you can    Don't move    You need to stay very still to maintain control  SECOND Squeeze your pelvic floor muscles 5 times, like a quick flick, to keep from leaking  THIRD Relax  Take a deep breath and then let it out  Try to make the urge go away by using relaxation and  visualization techniques  FINALLY When you feel the urge go away somewhat, walk normally to the bathroom.   If the urge gets suddenly stronger on the way, you may stop again and relax to regain control.  

## 2021-11-21 NOTE — Therapy (Signed)
[facility] @ [place], [place], [address] Phone: [phone]   Fax:  [phone]  Physical Therapy Treatment  Patient Details  Name: Belinda Maxwell MRN: [id number] Date of Birth: August 13, 1966 Referring Provider (PT): Caren Macadam, MD   Encounter Date: 11/21/2021   PT End of Session - 11/21/21 1443     Visit Number 2    Date for PT Re-Evaluation 01/22/22    Authorization Type [employer]    PT Start Time 1400    PT Stop Time 1443    PT Time Calculation (min) 43 min    Activity Tolerance Patient tolerated treatment well    Behavior During Therapy [facility] for tasks assessed/performed             Past Medical History:  Diagnosis Date   Acute lateral meniscal injury of right knee 06/11/2016   Headache 02/03/2008   Qualifier: Diagnosis of  By: Leanne Chang MD, Bruce  Menstrual related    Headache(784.0)    Hearing loss    Hemorrhoid 01/11/2016   -sees surgeon in [place] flashes 01/11/2016   -seeing gynecologist    Hyperlipemia 01/11/2016   Hypertension 04/19/2010   Qualifier: Diagnosis of  By: Leanne Chang MD, Bruce      Lumbar radiculopathy, right 08/06/2016   Pre-eclampsia     Past Surgical History:  Procedure Laterality Date   CHOLECYSTECTOMY     hemorrhoid banding      There were no vitals filed for this visit.   Subjective Assessment - 11/21/21 1403     Subjective Pt reports she feels her leakage is getting better, reporting no leakage since last visit and has been working on retraining bladder with use of urge drill.    Pertinent History 2 childrend vaginal and episiotomy with the first    How long can you sit comfortably? no limits    How long can you stand comfortably? no limits    How long can you walk comfortably? no limits    Currently in Pain? No/denies                               [facility] Adult PT Treatment/Exercise - 11/21/21 0001       Self-Care   Self-Care Other  Self-Care Comments    Other Self-Care Comments  pt educated on bladder diary and urge drill      Exercises   Exercises Knee/Hip;Lumbar      Lumbar Exercises: Seated   Sit to Stand 10 reps    Sit to Stand Limitations coordination of pelvic floor and breathing, pt reports she has a harder time contracting in standing compared to sitting. Pt educated on improved feedback with hand at external/over clothes at vaginal opening to see if she can feel tissues moving upward and relaxing. Pt agreed and understood    Other Seated Lumbar Exercises pt directed in 2x10 pelvic floor contractions; 1P37 quick flicks and x5 8s holds, pt reports she was unable to hold contraction for 8s in sitting and able to do 4s but felt much stronger in supine at home. Pt benefited from cues for decreased gluteal compensatory strategies and breathing patterns to not hold breath.                     PT Education - 11/21/21 1444     Education Details Pt educated on urge drill, bladder  diary and continued HEP    Person(s) Educated Patient    Methods Explanation;Demonstration;Tactile cues;Verbal cues;Handout    Comprehension Verbalized understanding;Returned demonstration              PT Short Term Goals - 10/24/21 0855       PT SHORT TERM GOAL #1   Title pt to be I with HEP    Time 5    Period Weeks    Status New    Target Date 11/28/21      PT SHORT TERM GOAL #2   Title pt to demonstrate at least 4/5 pelvic floor strength with ability to hold for 10s and fully relax after, and completed improved Z61 quick flicks at consistent timing for improved urinary leakage symptoms    Time 5    Period Weeks    Status New    Target Date 11/28/21      PT SHORT TERM GOAL #3   Title pt to report no more than 1 urinary leak per day for improved symptoms and QOL.    Time 5    Period Weeks    Status New    Target Date 11/28/21      PT SHORT TERM GOAL #4   Title pt to report at least 2.5 hours between  bladder voids to improve QOL with community outings without leakage.    Time 5    Period Weeks    Status New    Target Date 11/28/21               PT Long Term Goals - 10/24/21 0857       PT LONG TERM GOAL #1   Title Pt to be I with advanced HEP    Time 3    Period Months    Status New    Target Date 01/22/22      PT LONG TERM GOAL #2   Title pt to demonstrate at least 5/5 pelvic floor strength with ability to hold for 10s and fully relax after, and completed improved W96 quick flicks at consistent timing for improved urinary leakage symptoms    Time 3    Period Months    Status New    Target Date 01/22/22      PT LONG TERM GOAL #3   Title pt to report no more than 1 urinary leak per week for improved symptoms and QOL.    Time 3    Period Months    Status New    Target Date 01/22/22      PT LONG TERM GOAL #4   Title pt to report at least 3.5 hours between bladder voids to improve QOL with community outings without leakage.    Time 3    Period Months    Status New    Target Date 01/22/22      PT LONG TERM GOAL #5   Title pt to demonstrate at least 5/5 bil hip strength in all directions for improved pelvic stability and support for functional tasks without leakage.    Time 3    Period Months    Status New    Target Date 01/22/22                   Plan - 11/21/21 1445     Clinical Impression Statement Pt presents to clinic reporting improvement in leakage with it occuring less frequently from what she can tell and thinks her urge intensity has gotten better as  well. Pt reports she does sometimes not know if she has had leakage but notices her underwear is less damp. Pt reports she has been working on attempting to increase time between voids but is usually ever 30-30mins but drinks about 16oz of water at one time a few times per day and then will have to urinate quickly after this (about 30-45 mins after). Pt educated on spreading out water intake to see  if this helps. Pt agreed, urge drill went over and pt verbalized understanding, and session then focused on pelvic floor strengthening in sitting and attempts in standing, pt educated on externally feeling if tissues are moving upward/downward with contract/relax to self assess at home and pt reported shed feel more comfortable with this. t motivated to participate in PT and improve symptoms, and would benefit from additional PT to further address deficits with urinary leakage.    Personal Factors and Comorbidities Time since onset of injury/illness/exacerbation;Comorbidity 2;Fitness    Comorbidities x2 vaginal births    Examination-Activity Limitations Continence;Squat;Locomotion Level;Toileting    Examination-Participation Restrictions Community Activity    Stability/Clinical Decision Making Stable/Uncomplicated    Rehab Potential Good    PT Frequency 1x / week    PT Treatment/Interventions ADLs/Self Care Home Management;Functional mobility training;Therapeutic activities;Therapeutic exercise;Neuromuscular re-education;Manual techniques;Patient/family education;Scar mobilization;Passive range of motion;Energy conservation    PT Next Visit Plan strengthening and stretching of hips and spine with coordination of PF/core/breathing    PT Home Exercise Plan [id number]    Consulted and Agree with Plan of Care Patient             Patient will benefit from skilled therapeutic intervention in order to improve the following deficits and impairments:  Decreased coordination, Decreased endurance, Increased fascial restricitons, Improper body mechanics, Impaired flexibility, Decreased strength, Decreased mobility  Visit Diagnosis: Muscle weakness (generalized)  Other lack of coordination     Problem List Patient Active Problem List   Diagnosis Date Noted   Acute hypoxemic respiratory failure due to COVID-19 ([place]) 06/03/2020   Nevus 11/11/2017   Arthritis of carpometacarpal (CMC) joints of both  thumbs 11/07/2016   Piriformis syndrome of right side 07/09/2016   Hot flashes 01/11/2016   Hemorrhoid 01/11/2016   Hearing loss 01/11/2016   Hyperlipemia 01/11/2016   Hypertension 04/19/2010    Stacy Gardner, PT, DPT 02/08/232:48 PM   [facility] @ [place], [place], [address] Phone: [phone]   Fax:  [phone]  Name: JERNI SELMER MRN: [id number] Date of Birth: Jan 24, 1966

## 2021-11-28 ENCOUNTER — Other Ambulatory Visit: Payer: Self-pay

## 2021-11-28 ENCOUNTER — Other Ambulatory Visit: Payer: Self-pay | Admitting: Family Medicine

## 2021-11-28 ENCOUNTER — Ambulatory Visit: Payer: No Typology Code available for payment source | Admitting: Physical Therapy

## 2021-11-28 DIAGNOSIS — M6281 Muscle weakness (generalized): Secondary | ICD-10-CM | POA: Diagnosis not present

## 2021-11-28 DIAGNOSIS — I10 Essential (primary) hypertension: Secondary | ICD-10-CM

## 2021-11-28 DIAGNOSIS — R278 Other lack of coordination: Secondary | ICD-10-CM

## 2021-11-28 NOTE — Therapy (Signed)
[facility] @ [place], [place], [address] Phone: [phone]   Fax:  [phone]  Physical Therapy Treatment  Patient Details  Name: FRANCENA ZENDER MRN: [id number] Date of Birth: 24-Sep-1966 Referring Provider (PT): Caren Macadam, MD   Encounter Date: 11/28/2021   PT End of Session - 11/28/21 1440     Visit Number 3    Date for PT Re-Evaluation 01/22/22    Authorization Type [employer]et[employer]    PT Start Time 1400    PT Stop Time 1440    PT Time Calculation (min) 40 min    Activity Tolerance Patient tolerated treatment well    Behavior During Therapy [facility] for tasks assessed/performed             Past Medical History:  Diagnosis Date   Acute lateral meniscal injury of right knee 06/11/2016   Headache 02/03/2008   Qualifier: Diagnosis of  By: Leanne Chang MD, Bruce  Menstrual related    Headache(784.0)    Hearing loss    Hemorrhoid 01/11/2016   -sees surgeon in [place] flashes 01/11/2016   -seeing gynecologist    Hyperlipemia 01/11/2016   Hypertension 04/19/2010   Qualifier: Diagnosis of  By: Leanne Chang MD, Bruce      Lumbar radiculopathy, right 08/06/2016   Pre-eclampsia     Past Surgical History:  Procedure Laterality Date   CHOLECYSTECTOMY     hemorrhoid banding      There were no vitals filed for this visit.   Subjective Assessment - 11/28/21 1359     Subjective Pt reports she continues to have no leakage and very pleased with this.    Pertinent History 2 children vaginal and episiotomy with the first    How long can you sit comfortably? no limits    How long can you stand comfortably? no limits    How long can you walk comfortably? no limits    Patient Stated Goals to have leak    Currently in Pain? No/denies                            Pelvic Floor Special Questions - 11/28/21 0001     Pelvic Floor Internal Exam pt denied               [facility] Adult PT  Treatment/Exercise - 11/28/21 0001       Self-Care   Self-Care Other Self-Care Comments    Other Self-Care Comments  pt educated on HEP updates for continued hip and pelvis flexibility and pelvic floor strengthening. Pt demonstrated all in session with good technique and resources in community for continued mobility as pt wanted to continue mobility after PT.      Lumbar Exercises: Stretches   Passive Hamstring Stretch Right;Left;30 seconds;3 reps    Piriformis Stretch Right;Left;30 seconds;3 reps    Other Lumbar Stretch Exercise v-sit 2x30s; butterfly 2x30s                       PT Short Term Goals - 11/28/21 1409       PT SHORT TERM GOAL #1   Title pt to be I with HEP    Time 5    Period Weeks    Status Achieved    Target Date 11/28/21      PT SHORT TERM GOAL #2   Title pt to demonstrate at least 4/5 pelvic  floor strength with ability to hold for 10s and fully relax after, and completed improved W09 quick flicks at consistent timing for improved urinary leakage symptoms    Time 5    Period Weeks    Status Unable to assess   pt declined need for reassessment as her symptoms have resolved.   Target Date 11/28/21      PT SHORT TERM GOAL #3   Title pt to report no more than 1 urinary leak per day for improved symptoms and QOL.    Time 5    Period Weeks    Status Achieved    Target Date 11/28/21      PT SHORT TERM GOAL #4   Title pt to report at least 2.5 hours between bladder voids to improve QOL with community outings without leakage.    Time 5    Period Weeks    Status Achieved    Target Date 11/28/21               PT Long Term Goals - 11/28/21 1410       PT LONG TERM GOAL #1   Title Pt to be I with advanced HEP    Time 3    Period Months    Status Achieved    Target Date 01/22/22      PT LONG TERM GOAL #2   Title pt to demonstrate at least 5/5 pelvic floor strength with ability to hold for 10s and fully relax after, and completed improved  W11 quick flicks at consistent timing for improved urinary leakage symptoms    Time 3    Period Months    Status Unable to assess   pt declined need for reassessment as her symptoms have resolved.   Target Date 01/22/22      PT LONG TERM GOAL #3   Title pt to report no more than 1 urinary leak per week for improved symptoms and QOL.    Time 3    Period Months    Status Achieved    Target Date 01/22/22      PT LONG TERM GOAL #4   Title pt to report at least 3.5 hours between bladder voids to improve QOL with community outings without leakage.    Time 3    Period Months    Status Not Met   pt reports she is not bothered by frequency any longer and could hold it 3 hours if needed   Target Date 01/22/22      PT LONG TERM GOAL #5   Title pt to demonstrate at least 5/5 bil hip strength in all directions for improved pelvic stability and support for functional tasks without leakage.    Time 3    Period Months    Status Achieved    Target Date 01/22/22                    Patient will benefit from skilled therapeutic intervention in order to improve the following deficits and impairments:     Visit Diagnosis: Other lack of coordination  Muscle weakness (generalized)     Problem List Patient Active Problem List   Diagnosis Date Noted   Acute hypoxemic respiratory failure due to COVID-19 ([place]) 06/03/2020   Nevus 11/11/2017   Arthritis of carpometacarpal (CMC) joints of both thumbs 11/07/2016   Piriformis syndrome of right side 07/09/2016   Hot flashes 01/11/2016   Hemorrhoid 01/11/2016   Hearing loss 01/11/2016   Hyperlipemia  01/11/2016   Hypertension 04/19/2010    PHYSICAL THERAPY DISCHARGE SUMMARY  Visits from Start of Care: 3  Current functional level related to goals / functional outcomes: Pt continues to void sooner than every 3 hours (LTG 3.5) however pt reports this does not bother her and she understands how to continue with urge drill as needed.     Remaining deficits: No leakage at all, does have bladder voids sooner than 3 hours.    Education / Equipment: HEP   Patient agrees to discharge. Patient goals were partially met. Patient is being discharged due to being pleased with the current functional level. Thank you for the referral.    Junie Panning, PT 11/28/2021, 2:41 PM  [facility] @ [address] [place], [place], [address] Phone: [phone]   Fax:  [phone]  Name: COURTENAY HIRTH MRN: [id number] Date of Birth: Apr 04, 1966

## 2021-12-05 ENCOUNTER — Encounter: Payer: No Typology Code available for payment source | Admitting: Physical Therapy

## 2021-12-12 ENCOUNTER — Encounter: Payer: No Typology Code available for payment source | Admitting: Physical Therapy

## 2021-12-19 ENCOUNTER — Encounter: Payer: No Typology Code available for payment source | Admitting: Physical Therapy

## 2021-12-26 ENCOUNTER — Encounter: Payer: No Typology Code available for payment source | Admitting: Physical Therapy

## 2021-12-27 ENCOUNTER — Telehealth: Payer: Self-pay | Admitting: Family Medicine

## 2021-12-27 NOTE — Telephone Encounter (Signed)
Patient was sent over from triage for SOB symptoms described by patient as not being able to take full, deep, breath.Nurse stated she was refusing ED outcome and wanted appointment instead. I tried to schedule patient for appointment today (12/27/21) but patient refused and stated she wanted to see pcp tomorrow if possible. Patient was scheduled for tomorrow (12/28/21) with pcp. ? ? ? ? ? ? ?Please advise/FYI ?

## 2021-12-28 ENCOUNTER — Other Ambulatory Visit: Payer: Self-pay

## 2021-12-28 ENCOUNTER — Ambulatory Visit: Payer: No Typology Code available for payment source | Admitting: Family Medicine

## 2021-12-28 ENCOUNTER — Encounter: Payer: Self-pay | Admitting: Family Medicine

## 2021-12-28 ENCOUNTER — Ambulatory Visit (INDEPENDENT_AMBULATORY_CARE_PROVIDER_SITE_OTHER): Payer: No Typology Code available for payment source

## 2021-12-28 VITALS — BP 104/76 | HR 67 | Temp 98.2°F | Ht 67.0 in | Wt 179.6 lb

## 2021-12-28 DIAGNOSIS — R5383 Other fatigue: Secondary | ICD-10-CM

## 2021-12-28 DIAGNOSIS — R0602 Shortness of breath: Secondary | ICD-10-CM | POA: Diagnosis not present

## 2021-12-28 LAB — COMPREHENSIVE METABOLIC PANEL
ALT: 24 U/L (ref 0–35)
AST: 26 U/L (ref 0–37)
Albumin: 4.8 g/dL (ref 3.5–5.2)
Alkaline Phosphatase: 65 U/L (ref 39–117)
BUN: 20 mg/dL (ref 6–23)
CO2: 29 mEq/L (ref 19–32)
Calcium: 10.6 mg/dL — ABNORMAL HIGH (ref 8.4–10.5)
Chloride: 103 mEq/L (ref 96–112)
Creatinine, Ser: 1.04 mg/dL (ref 0.40–1.20)
GFR: 59.47 mL/min — ABNORMAL LOW (ref >60.00)
Glucose, Bld: 95 mg/dL (ref 70–99)
Potassium: 4.1 mEq/L (ref 3.5–5.1)
Sodium: 139 mEq/L (ref 135–145)
Total Bilirubin: 0.5 mg/dL (ref 0.2–1.2)
Total Protein: 7.5 g/dL (ref 6.0–8.3)

## 2021-12-28 LAB — CBC WITH DIFFERENTIAL/PLATELET
Basophils Absolute: 0 10*3/uL (ref 0.0–0.1)
Basophils Relative: 0.4 % (ref 0.0–3.0)
Eosinophils Absolute: 0.1 10*3/uL (ref 0.0–0.7)
Eosinophils Relative: 1.3 % (ref 0.0–5.0)
HCT: 40.1 % (ref 36.0–46.0)
Hemoglobin: 13.7 g/dL (ref 12.0–15.0)
Lymphocytes Relative: 42.7 % (ref 12.0–46.0)
Lymphs Abs: 2.5 10*3/uL (ref 0.7–4.0)
MCHC: 34.2 g/dL (ref 30.0–36.0)
MCV: 94.3 fl (ref 78.0–100.0)
Monocytes Absolute: 0.5 10*3/uL (ref 0.1–1.0)
Monocytes Relative: 8 % (ref 3.0–12.0)
Neutro Abs: 2.8 10*3/uL (ref 1.4–7.7)
Neutrophils Relative %: 47.6 % (ref 43.0–77.0)
Platelets: 263 10*3/uL (ref 150.0–400.0)
RBC: 4.26 Mil/uL (ref 3.87–5.11)
RDW: 13.5 % (ref 11.5–15.5)
WBC: 5.8 10*3/uL (ref 4.0–10.5)

## 2021-12-28 LAB — T4, FREE: Free T4: 0.94 ng/dL (ref 0.60–1.60)

## 2021-12-28 LAB — T3, FREE: T3, Free: 3.5 pg/mL (ref 2.3–4.2)

## 2021-12-28 LAB — TSH: TSH: 1.54 u[IU]/mL (ref 0.35–5.50)

## 2021-12-28 MED ORDER — ALBUTEROL SULFATE (2.5 MG/3ML) 0.083% IN NEBU
2.5000 mg | INHALATION_SOLUTION | Freq: Once | RESPIRATORY_TRACT | Status: AC
  Administered 2021-12-28: 2.5 mg via RESPIRATORY_TRACT

## 2021-12-28 NOTE — Progress Notes (Signed)
?Cindy Hazy ?DOB: 1966/07/02 ?Encounter date: 12/28/2021 ? ?This is a 56 y.o. female who presents with ?Chief Complaint  ?Patient presents with  ? Shortness of Breath  ? ? ?History of present illness: ?A week ago was sitting, reading a book and felt like she couldn't get full breath to top of lungs. Has been doing this since last Friday. If she does abdominal breathing she can do it. Then made appointment and felt good, but felt like sx came back again this afternoon; feels better again now. No chest pain, no cough. Pulse ox normal. Heart rate normal. Doesn't feel like it is her heart.  ? ?Has felt this in past and wondered if she had exercise induced asthma. Thinks that she started to notice this before covid. Has hill when she walks - and would note that she was short of breath. Would calm down. No wheezing. This is first time she has had sx at rest.  ? ?No childhood asthma. No hx of prolonged cough. Used steroid inhaler when she had covid. Son has allergies/asthma; more childhood.  ? ?She does have some allergies.  ? ?Not waking at night. Doesn't have to stop with exercise to catch breath. Breathing feels better when laying flat. ? ?Had covid 05/2020.  ? ?Allergies  ?Allergen Reactions  ? Sulfamethoxazole Hives  ? ?Current Meds  ?Medication Sig  ? acyclovir (ZOVIRAX) 400 MG tablet TAKE 1 TABLET (400 MG TOTAL) BY MOUTH 5 (FIVE) TIMES DAILY. START AT ONSET OF RASH  ? lisinopril (ZESTRIL) 10 MG tablet TAKE 1 TABLET BY MOUTH EVERY DAY  ? meloxicam (MOBIC) 15 MG tablet TAKE 1 TABLET BY MOUTH EVERY DAY AS NEEDED FOR PAIN  ? mupirocin nasal ointment (BACTROBAN NASAL) 2 % Place 1 application into the nose 2 (two) times daily. Use one-half of tube in each nostril twice daily for five (5) days. After application, press sides of nose together and gently massage.  ? SUMAtriptan (IMITREX) 100 MG tablet TAKE 1 TABLET BY MOUTH TWICE A DAY AS NEEDED  ? ? ?Review of Systems  ?Constitutional:  Negative for chills, fatigue and  fever.  ?HENT:  Negative for congestion, sinus pressure, sinus pain and sore throat.   ?Respiratory:  Positive for shortness of breath. Negative for cough, chest tightness and wheezing.   ?Cardiovascular:  Negative for chest pain, palpitations and leg swelling.  ? ?Objective: ? ?BP 104/76 (BP Location: Left Arm, Patient Position: Sitting, Cuff Size: Normal)   Pulse 67   Temp 98.2 ?F (36.8 ?C) (Oral)   Ht '5\' 7"'$  (1.702 m)   Wt 179 lb 9.6 oz (81.5 kg)   SpO2 100%   BMI 28.13 kg/m?   Weight: 179 lb 9.6 oz (81.5 kg)  ? ?BP Readings from Last 3 Encounters:  ?12/28/21 104/76  ?10/10/21 98/70  ?08/31/21 122/86  ? ?Wt Readings from Last 3 Encounters:  ?12/28/21 179 lb 9.6 oz (81.5 kg)  ?10/10/21 178 lb 8 oz (81 kg)  ?08/31/21 172 lb (78 kg)  ? ? ?Physical Exam ?Constitutional:   ?   General: She is not in acute distress. ?   Appearance: She is well-developed.  ?Cardiovascular:  ?   Rate and Rhythm: Normal rate and regular rhythm.  ?   Heart sounds: Normal heart sounds. No murmur heard. ?  No friction rub.  ?Pulmonary:  ?   Effort: Pulmonary effort is normal. No respiratory distress.  ?   Breath sounds: Normal breath sounds. No decreased breath sounds, wheezing, rhonchi  or rales.  ?Chest:  ?   Chest wall: No tenderness.  ?Musculoskeletal:  ?   Right lower leg: No edema.  ?   Left lower leg: No edema.  ?Neurological:  ?   Mental Status: She is alert and oriented to person, place, and time.  ?Psychiatric:     ?   Behavior: Behavior normal.  ? ? ?Assessment/Plan ? ?1. Shortness of breath ?Patient currently asymptomatic.  Symptoms are coming little bit more with activity.  EKG today is reassuring in sinus rhythm without any acute changes.  I did give her an albuterol treatment to see if this helps at all with breathing over the next 24 hours.  She seems to be able to exercise through increased work of breathing, but if lab work and chest x-ray are normal and patient does not have improvement with albuterol inhaler, I would  consider doing stress testing and/or pulmonary function testing.   ?- D-dimer, Quantitative; Future ?- EKG 12-Lead; Future ?- CBC with Differential/Platelet; Future ?- Comprehensive metabolic panel; Future ?- TSH; Future ?- DG Chest 2 View; Future ?- TSH ?- Comprehensive metabolic panel ?- CBC with Differential/Platelet ?- D-dimer, Quantitative ?- albuterol (PROVENTIL) (2.5 MG/3ML) 0.083% nebulizer solution 2.5 mg ? ?2. Fatigue, unspecified type ?- T4, free; Future ?- T3, free; Future ?- T3, free ?- T4, free ? ? ? ?Return for pending lab or imaging results. ?35 minutes spent in chart review, time with patient, discussion of treatment plan, exam, charting. ? ? ?Micheline Rough, MD ?

## 2021-12-29 LAB — D-DIMER, QUANTITATIVE: D-Dimer, Quant: 0.22 mcg/mL FEU (ref <0.50)

## 2021-12-31 ENCOUNTER — Other Ambulatory Visit: Payer: No Typology Code available for payment source

## 2021-12-31 NOTE — Addendum Note (Signed)
Addended by: Agnes Lawrence on: 12/31/2021 02:20 PM ? ? Modules accepted: Orders ? ?

## 2022-01-02 ENCOUNTER — Encounter: Payer: No Typology Code available for payment source | Admitting: Physical Therapy

## 2022-01-02 ENCOUNTER — Telehealth: Payer: Self-pay

## 2022-01-02 NOTE — Telephone Encounter (Signed)
LVM instructions for pt to call office to schedule lab appt. Does not need to be fasting. ?

## 2022-01-03 ENCOUNTER — Other Ambulatory Visit: Payer: No Typology Code available for payment source

## 2022-01-04 LAB — PTH, INTACT AND CALCIUM
Calcium: 10.2 mg/dL (ref 8.6–10.4)
PTH: 47 pg/mL (ref 16–77)

## 2022-01-09 ENCOUNTER — Encounter: Payer: No Typology Code available for payment source | Admitting: Physical Therapy

## 2022-01-18 ENCOUNTER — Other Ambulatory Visit: Payer: Self-pay | Admitting: Family Medicine

## 2022-01-18 DIAGNOSIS — R0602 Shortness of breath: Secondary | ICD-10-CM

## 2022-01-18 NOTE — Progress Notes (Signed)
Order placed

## 2022-01-31 ENCOUNTER — Encounter: Payer: Self-pay | Admitting: Family Medicine

## 2022-02-10 ENCOUNTER — Other Ambulatory Visit: Payer: Self-pay | Admitting: Family Medicine

## 2022-02-12 MED ORDER — MELOXICAM 15 MG PO TABS: ORAL_TABLET | ORAL | 2 refills | Status: DC

## 2022-02-12 NOTE — Addendum Note (Signed)
Addended by: Agnes Lawrence on: 02/12/2022 10:23 AM ? ? Modules accepted: Orders ? ?

## 2022-02-13 LAB — HM MAMMOGRAPHY

## 2022-02-14 LAB — HM MAMMOGRAPHY

## 2022-02-19 ENCOUNTER — Encounter: Payer: Self-pay | Admitting: Family Medicine

## 2022-03-06 ENCOUNTER — Encounter (HOSPITAL_COMMUNITY): Payer: Self-pay | Admitting: *Deleted

## 2022-03-13 ENCOUNTER — Ambulatory Visit (HOSPITAL_BASED_OUTPATIENT_CLINIC_OR_DEPARTMENT_OTHER): Payer: No Typology Code available for payment source

## 2022-03-13 ENCOUNTER — Ambulatory Visit (HOSPITAL_COMMUNITY): Payer: No Typology Code available for payment source | Attending: Cardiology

## 2022-03-13 DIAGNOSIS — R0602 Shortness of breath: Secondary | ICD-10-CM

## 2022-03-13 LAB — ECHOCARDIOGRAM STRESS TEST
Area-P 1/2: 2.72 cm2
S' Lateral: 2.2 cm

## 2022-03-13 MED ORDER — PERFLUTREN LIPID MICROSPHERE
6.0000 mL | INTRAVENOUS | Status: AC | PRN
  Administered 2022-03-13: 6 mL via INTRAVENOUS

## 2022-06-10 ENCOUNTER — Other Ambulatory Visit: Payer: Self-pay | Admitting: *Deleted

## 2022-06-10 DIAGNOSIS — I10 Essential (primary) hypertension: Secondary | ICD-10-CM

## 2022-06-10 MED ORDER — LISINOPRIL 10 MG PO TABS: 10.0000 mg | ORAL_TABLET | Freq: Every day | ORAL | 0 refills | Status: DC

## 2022-06-26 ENCOUNTER — Other Ambulatory Visit: Payer: Self-pay | Admitting: *Deleted

## 2022-06-26 MED ORDER — SUMATRIPTAN SUCCINATE 100 MG PO TABS: 100.0000 mg | ORAL_TABLET | Freq: Two times a day (BID) | ORAL | 0 refills | Status: DC | PRN

## 2022-06-26 MED ORDER — ACYCLOVIR 400 MG PO TABS: ORAL_TABLET | ORAL | 0 refills | Status: DC

## 2022-07-04 ENCOUNTER — Encounter: Payer: Self-pay | Admitting: Family Medicine

## 2022-07-04 ENCOUNTER — Ambulatory Visit (INDEPENDENT_AMBULATORY_CARE_PROVIDER_SITE_OTHER): Payer: No Typology Code available for payment source | Admitting: Family Medicine

## 2022-07-04 VITALS — BP 110/70 | HR 66 | Temp 97.8°F | Ht 67.0 in | Wt 178.9 lb

## 2022-07-04 DIAGNOSIS — M129 Arthropathy, unspecified: Secondary | ICD-10-CM | POA: Diagnosis not present

## 2022-07-04 DIAGNOSIS — Z1211 Encounter for screening for malignant neoplasm of colon: Secondary | ICD-10-CM

## 2022-07-04 DIAGNOSIS — G43009 Migraine without aura, not intractable, without status migrainosus: Secondary | ICD-10-CM | POA: Diagnosis not present

## 2022-07-04 DIAGNOSIS — I10 Essential (primary) hypertension: Secondary | ICD-10-CM | POA: Diagnosis not present

## 2022-07-04 MED ORDER — LISINOPRIL 10 MG PO TABS: 10.0000 mg | ORAL_TABLET | Freq: Every day | ORAL | 3 refills | Status: DC

## 2022-07-04 MED ORDER — MELOXICAM 15 MG PO TABS: ORAL_TABLET | ORAL | 5 refills | Status: DC

## 2022-07-04 MED ORDER — SUMATRIPTAN SUCCINATE 100 MG PO TABS: 100.0000 mg | ORAL_TABLET | Freq: Two times a day (BID) | ORAL | 5 refills | Status: DC | PRN

## 2022-07-04 NOTE — Progress Notes (Signed)
Established Patient Office Visit  Subjective   Patient ID: Belinda Maxwell, female    DOB: April 03, 1966  Age: 56 y.o. MRN: [id number]  Chief Complaint  Patient presents with   Transitions Of Care    Patient is here for transition of care visit. She is reporting 1 month history of left calf pain and also posterior hip pain, states she has the meloxicam and it does work however she doesn't like to take it every day, states that she is going to a PT and getting some massage therapy which is helping.  HTN -- BP in office performed and is well controlled. She reports no side effects to the medications, no chest pain, SOB, dizziness or headaches. She has a BP cuff at home and is checking her BP regularly, reports they are in the normal range.    I reviewed her current problem list, she reports she needs refills on her imitrex, states she doesn't get migraines often but needs the medication just in case to have at home.   We also reviewed her HM and she is due for cologuard testing. Orders plaed. Otherwise she is UTD on her pap and mammo, she declined the flu shot today.   Current Outpatient Medications  Medication Instructions   acyclovir (ZOVIRAX) 400 MG tablet TAKE 1 TABLET (400 MG TOTAL) BY MOUTH 5 (FIVE) TIMES DAILY. START AT ONSET OF RASH   lisinopril (ZESTRIL) 10 mg, Oral, Daily   meloxicam (MOBIC) 15 MG tablet TAKE 1 TABLET BY MOUTH EVERY DAY AS NEEDED FOR PAIN   mupirocin nasal ointment (BACTROBAN NASAL) 2 % 1 application , Nasal, 2 times daily, Use one-half of tube in each nostril twice daily for five (5) days. After application, press sides of nose together and gently massage.   SUMAtriptan (IMITREX) 100 mg, Oral, 2 times daily PRN, May repeat in 2 hours if headache persists or recurs.     Patient Active Problem List   Diagnosis Date Noted   Migraine without aura and without status migrainosus, not intractable 07/05/2022   Acute hypoxemic respiratory failure due to COVID-19 [facility])  06/03/2020   Nevus 11/11/2017   Arthritis of carpometacarpal (CMC) joints of both thumbs 11/07/2016   Piriformis syndrome of right side 07/09/2016   Hot flashes 01/11/2016   Hemorrhoid 01/11/2016   Hearing loss 01/11/2016   Hyperlipemia 01/11/2016   Hypertension 04/19/2010      Review of Systems  All other systems reviewed and are negative.     Objective:     BP 110/70 (BP Location: Left Arm, Patient Position: Sitting, Cuff Size: Normal)   Pulse 66   Temp 97.8 F (36.6 C) (Oral)   Ht '5\' 7"'$  (1.702 m)   Wt 178 lb 14.4 oz (81.1 kg)   SpO2 98%   BMI 28.02 kg/m  BP Readings from Last 3 Encounters:  07/04/22 110/70  12/28/21 104/76  10/10/21 98/70      Physical Exam Vitals reviewed.  Constitutional:      Appearance: Normal appearance. She is well-groomed and normal weight.  Neck:     Thyroid: No thyromegaly.  Cardiovascular:     Rate and Rhythm: Normal rate and regular rhythm.     Heart sounds: S1 normal and S2 normal.  Pulmonary:     Effort: Pulmonary effort is normal.     Breath sounds: Normal breath sounds and air entry.  Abdominal:     General: Abdomen is flat. Bowel sounds are normal.  Musculoskeletal:  General: Normal range of motion.     Cervical back: Neck supple.     Right lower leg: No edema.     Left lower leg: No edema.  Neurological:     Mental Status: She is alert and oriented to person, place, and time. Mental status is at baseline.     Gait: Gait is intact.  Psychiatric:        Mood and Affect: Mood and affect normal.        Speech: Speech normal.        Behavior: Behavior normal.        Judgment: Judgment normal.      No results found for any visits on 07/04/22.  Last lipids Lab Results  Component Value Date   CHOL 236 (H) 10/10/2021   HDL 57.10 10/10/2021   LDLCALC 152 (H) 10/10/2021   TRIG 137.0 10/10/2021   CHOLHDL 4 10/10/2021      The 10-year ASCVD risk score (Arnett DK, et al., 2019) is: 3%    Assessment & Plan:    Problem List Items Addressed This Visit       Cardiovascular and Mediastinum   Hypertension - Primary    Current hypertension medications:       Sig   lisinopril (ZESTRIL) 10 MG tablet Take 1 tablet (10 mg total) by mouth daily.  BP performed in office and is well controlled today, continue lisinopril as prescribed.       Relevant Medications   lisinopril (ZESTRIL) 10 MG tablet   Migraine without aura and without status migrainosus, not intractable (Chronic)    On Imitrex 100 mg as needed for acute migraine, pt reports good response to this medication, will refill this for the patient.       Relevant Medications   lisinopril (ZESTRIL) 10 MG tablet   meloxicam (MOBIC) 15 MG tablet   SUMAtriptan (IMITREX) 100 MG tablet   Other Visit Diagnoses     Essential hypertension       Relevant Medications   lisinopril (ZESTRIL) 10 MG tablet   Arthritis/arthropathy of multiple joints       Relevant Medications   meloxicam (MOBIC) 15 MG tablet   Colon cancer screening       Relevant Orders   Cologuard       Return in about 6 months (around 01/02/2023) for Annual physical exam.    Farrel Conners, MD

## 2022-07-04 NOTE — Assessment & Plan Note (Signed)
Current hypertension medications:      Sig   lisinopril (ZESTRIL) 10 MG tablet Take 1 tablet (10 mg total) by mouth daily.    BP performed in office and is well controlled today, continue lisinopril as prescribed.

## 2022-07-05 DIAGNOSIS — G43009 Migraine without aura, not intractable, without status migrainosus: Secondary | ICD-10-CM | POA: Insufficient documentation

## 2022-07-05 NOTE — Assessment & Plan Note (Signed)
On Imitrex 100 mg as needed for acute migraine, pt reports good response to this medication, will refill this for the patient.

## 2022-07-06 ENCOUNTER — Other Ambulatory Visit: Payer: Self-pay | Admitting: Family Medicine

## 2022-07-17 LAB — COLOGUARD: COLOGUARD: NEGATIVE

## 2022-07-17 NOTE — Progress Notes (Signed)
Negative cologard

## 2022-07-21 ENCOUNTER — Other Ambulatory Visit: Payer: Self-pay | Admitting: Family Medicine

## 2022-11-29 ENCOUNTER — Encounter: Payer: Self-pay | Admitting: Family Medicine

## 2023-01-07 ENCOUNTER — Encounter: Payer: No Typology Code available for payment source | Admitting: Family Medicine

## 2023-01-13 ENCOUNTER — Ambulatory Visit (INDEPENDENT_AMBULATORY_CARE_PROVIDER_SITE_OTHER): Payer: No Typology Code available for payment source | Admitting: Family Medicine

## 2023-01-13 ENCOUNTER — Encounter: Payer: Self-pay | Admitting: Family Medicine

## 2023-01-13 VITALS — BP 100/64 | HR 65 | Temp 98.0°F | Ht 66.5 in | Wt 185.6 lb

## 2023-01-13 DIAGNOSIS — E785 Hyperlipidemia, unspecified: Secondary | ICD-10-CM

## 2023-01-13 DIAGNOSIS — I10 Essential (primary) hypertension: Secondary | ICD-10-CM | POA: Diagnosis not present

## 2023-01-13 NOTE — Assessment & Plan Note (Signed)
Current hypertension medications:       Sig   lisinopril (ZESTRIL) 10 MG tablet (Taking) Take 1 tablet (10 mg total) by mouth daily.      BP is well controlled on the above medications, will continue as prescribed, she is due for her annual CMP and TSH today.

## 2023-01-13 NOTE — Progress Notes (Signed)
Established Patient Office Visit  Subjective   Patient ID: Belinda Maxwell, female    DOB: Oct 09, 1966  Age: 57 y.o. MRN: [id number]  Chief Complaint  Patient presents with   Establish Care    Pt is here for 6 month follow up of her HTN and HLD.   HTN -- BP in office performed and is well controlled. She  reports no side effects to the medications, no chest pain, SOB, dizziness or headaches. She has a BP cuff at home and is checking BP regularly, reports they are in the normal range.   HLD- needs new lipid panel today, she is not currently on medication for this problem, mostly just watching her diet.    Current Outpatient Medications  Medication Instructions   acyclovir (ZOVIRAX) 400 MG tablet TAKE 1 TABLET (400 MG TOTAL) BY MOUTH 5 (FIVE) TIMES DAILY. START AT ONSET OF RASH   lisinopril (ZESTRIL) 10 mg, Oral, Daily   meloxicam (MOBIC) 15 MG tablet TAKE 1 TABLET BY MOUTH EVERY DAY AS NEEDED FOR PAIN   mupirocin nasal ointment (BACTROBAN NASAL) 2 % 1 application , Nasal, 2 times daily, Use one-half of tube in each nostril twice daily for five (5) days. After application, press sides of nose together and gently massage.   SUMAtriptan (IMITREX) 100 mg, Oral, 2 times daily PRN, May repeat in 2 hours if headache persists or recurs.    Patient Active Problem List   Diagnosis Date Noted   Migraine without aura and without status migrainosus, not intractable 07/05/2022   Acute hypoxemic respiratory failure due to COVID-19 06/03/2020   Nevus 11/11/2017   Arthritis of carpometacarpal (CMC) joints of both thumbs 11/07/2016   Piriformis syndrome of right side 07/09/2016   Hot flashes 01/11/2016   Hemorrhoid 01/11/2016   Hearing loss 01/11/2016   Hyperlipemia 01/11/2016   Hypertension 04/19/2010      Review of Systems  All other systems reviewed and are negative.     Objective:     BP 100/64 (BP Location: Left Arm, Patient Position: Sitting, Cuff Size: Large)   Pulse 65   Temp  98 F (36.7 C) (Oral)   Ht 5' 6.5" (1.689 m)   Wt 185 lb 9.6 oz (84.2 kg)   SpO2 97%   BMI 29.51 kg/m    Physical Exam Vitals reviewed.  Constitutional:      Appearance: Normal appearance. She is well-groomed and normal weight.  Eyes:     Conjunctiva/sclera: Conjunctivae normal.  Cardiovascular:     Rate and Rhythm: Normal rate and regular rhythm.     Pulses: Normal pulses.     Heart sounds: S1 normal and S2 normal.  Pulmonary:     Effort: Pulmonary effort is normal.     Breath sounds: Normal breath sounds and air entry.  Abdominal:     General: Bowel sounds are normal.  Musculoskeletal:     Right lower leg: No edema.     Left lower leg: No edema.  Neurological:     Mental Status: She is alert and oriented to person, place, and time. Mental status is at baseline.     Gait: Gait is intact.  Psychiatric:        Mood and Affect: Mood and affect normal.        Speech: Speech normal.        Behavior: Behavior normal.        Judgment: Judgment normal.      No results found for any  visits on 01/13/23.    The 10-year ASCVD risk score (Arnett DK, et al., 2019) is: 2.7%    Assessment & Plan:   Problem List Items Addressed This Visit       Unprioritized   Hypertension - Primary    Current hypertension medications:       Sig   lisinopril (ZESTRIL) 10 MG tablet (Taking) Take 1 tablet (10 mg total) by mouth daily.     BP is well controlled on the above medications, will continue as prescribed, she is due for her annual CMP and TSH today.      Relevant Orders   CMP   TSH   Hyperlipemia    Due for lipid panel today.      Relevant Orders   Lipid Panel    Return in about 6 months (around 07/15/2023) for annual physical exam.    Farrel Conners, MD

## 2023-01-13 NOTE — Assessment & Plan Note (Signed)
Due for lipid panel today. 

## 2023-01-14 LAB — COMPREHENSIVE METABOLIC PANEL
ALT: 29 U/L (ref 0–35)
AST: 31 U/L (ref 0–37)
Albumin: 4.6 g/dL (ref 3.5–5.2)
Alkaline Phosphatase: 73 U/L (ref 39–117)
BUN: 11 mg/dL (ref 6–23)
CO2: 26 mEq/L (ref 19–32)
Calcium: 10.2 mg/dL (ref 8.4–10.5)
Chloride: 104 mEq/L (ref 96–112)
Creatinine, Ser: 0.95 mg/dL (ref 0.40–1.20)
GFR: 65.81 mL/min (ref >60.00)
Glucose, Bld: 86 mg/dL (ref 70–99)
Potassium: 3.9 mEq/L (ref 3.5–5.1)
Sodium: 137 mEq/L (ref 135–145)
Total Bilirubin: 0.5 mg/dL (ref 0.2–1.2)
Total Protein: 7.4 g/dL (ref 6.0–8.3)

## 2023-01-14 LAB — LIPID PANEL
Cholesterol: 194 mg/dL (ref 0–200)
HDL: 53 mg/dL (ref >39.00)
LDL Cholesterol: 112 mg/dL — ABNORMAL HIGH (ref 0–99)
NonHDL: 141.04
Total CHOL/HDL Ratio: 4
Triglycerides: 143 mg/dL (ref 0.0–149.0)
VLDL: 28.6 mg/dL (ref 0.0–40.0)

## 2023-01-14 LAB — TSH: TSH: 1.04 u[IU]/mL (ref 0.35–5.50)

## 2023-02-13 ENCOUNTER — Encounter: Payer: Self-pay | Admitting: Family Medicine

## 2023-02-13 ENCOUNTER — Ambulatory Visit: Payer: No Typology Code available for payment source | Admitting: Family Medicine

## 2023-02-13 VITALS — BP 120/82 | HR 70 | Temp 97.7°F | Ht 66.5 in | Wt 187.3 lb

## 2023-02-13 DIAGNOSIS — H6593 Unspecified nonsuppurative otitis media, bilateral: Secondary | ICD-10-CM | POA: Diagnosis not present

## 2023-02-13 NOTE — Patient Instructions (Signed)
Claritin D or allegra D will have a stronger decongestant.

## 2023-02-13 NOTE — Progress Notes (Signed)
   Acute Office Visit  Subjective:     Patient ID: Belinda Maxwell, female    DOB: Mar 28, 1966, 57 y.o.   MRN: [id number]  Chief Complaint  Patient presents with   Ear Pain    Bilateral ear pain x1 week, worse past 3 days    HPI Patient is in today for ear pain, both ears right worse than left. She thought initially it was fluid, is taking sudafed OTC however it is not helping. Some sore throat, no fever or chills, mild mucus production. States her sore throat progressed to laryngitis, thinks she might have picked something up from the preschoolers.   Review of Systems  All other systems reviewed and are negative.       Objective:    BP 120/82 (BP Location: Left Arm, Patient Position: Sitting, Cuff Size: Normal)   Pulse 70   Temp 97.7 F (36.5 C) (Oral)   Ht 5' 6.5" (1.689 m)   Wt 187 lb 4.8 oz (85 kg)   SpO2 98%   BMI 29.78 kg/m    Physical Exam Constitutional:      Appearance: Normal appearance. She is normal weight.  HENT:     Right Ear: Tympanic membrane and ear canal normal.     Left Ear: Tympanic membrane and ear canal normal.     Mouth/Throat:     Mouth: Mucous membranes are moist.     Pharynx: No posterior oropharyngeal erythema.  Eyes:     Conjunctiva/sclera: Conjunctivae normal.  Cardiovascular:     Rate and Rhythm: Normal rate and regular rhythm.     Heart sounds: Normal heart sounds.  Pulmonary:     Effort: Pulmonary effort is normal.     Breath sounds: Normal breath sounds. No wheezing.  Lymphadenopathy:     Cervical: No cervical adenopathy.  Neurological:     Mental Status: She is alert.     No results found for any visits on 02/13/23.      Assessment & Plan:   Problem List Items Addressed This Visit   None Visit Diagnoses     Middle ear effusion, bilateral    -  Primary     No signs of infection at this time. I recommended using Claritin D or Allegra D 10 mg once daily OTC. I advised she continue to try and pop ear ears to help  equalize the pressure. If by Monday she continues to have ear pain then we may try a medrol dose pak to help reduce inflammation  No orders of the defined types were placed in this encounter.   No follow-ups on file.  Karie Georges, MD

## 2023-02-25 ENCOUNTER — Encounter: Payer: Self-pay | Admitting: Family Medicine

## 2023-07-15 ENCOUNTER — Ambulatory Visit (INDEPENDENT_AMBULATORY_CARE_PROVIDER_SITE_OTHER): Payer: Managed Care, Other (non HMO) | Admitting: Family Medicine

## 2023-07-15 ENCOUNTER — Encounter: Payer: Self-pay | Admitting: Family Medicine

## 2023-07-15 VITALS — BP 116/70 | HR 64 | Temp 98.2°F | Ht 67.0 in | Wt 190.3 lb

## 2023-07-15 DIAGNOSIS — M129 Arthropathy, unspecified: Secondary | ICD-10-CM | POA: Diagnosis not present

## 2023-07-15 DIAGNOSIS — I10 Essential (primary) hypertension: Secondary | ICD-10-CM

## 2023-07-15 DIAGNOSIS — G43009 Migraine without aura, not intractable, without status migrainosus: Secondary | ICD-10-CM

## 2023-07-15 DIAGNOSIS — Z0000 Encounter for general adult medical examination without abnormal findings: Secondary | ICD-10-CM | POA: Diagnosis not present

## 2023-07-15 MED ORDER — LISINOPRIL 10 MG PO TABS
10.0000 mg | ORAL_TABLET | Freq: Every day | ORAL | 3 refills | Status: DC
Start: 2023-07-15 — End: 2024-07-07

## 2023-07-15 MED ORDER — SUMATRIPTAN SUCCINATE 100 MG PO TABS
100.0000 mg | ORAL_TABLET | Freq: Two times a day (BID) | ORAL | 5 refills | Status: DC | PRN
Start: 2023-07-15 — End: 2024-01-13

## 2023-07-15 MED ORDER — MELOXICAM 15 MG PO TABS
ORAL_TABLET | ORAL | 5 refills | Status: DC
Start: 2023-07-15 — End: 2024-01-13

## 2023-07-15 NOTE — Progress Notes (Signed)
Complete physical exam  Patient: Belinda Maxwell   DOB: Aug 04, 1966   57 y.o. Female  MRN: [id number]  Subjective:    Chief Complaint  Patient presents with   Annual Exam    Belinda Maxwell is a 57 y.o. female who presents today for a complete physical exam. She reports consuming a general diet. Home exercise routine includes walking 2 hrs per week. She generally feels well. She reports sleeping well. She does not have additional problems to discuss today.    Most recent fall risk assessment:    12/28/2021    1:31 PM  Fall Risk   Falls in the past year? 1  Number falls in past yr: 1  Injury with Fall? 0  Risk for fall due to : No Fall Risks  Follow up Falls evaluation completed     Most recent depression screenings:    02/13/2023    4:04 PM 01/13/2023    3:37 PM  PHQ 2/9 Scores  PHQ - 2 Score 0 0  PHQ- 9 Score 0 2    Vision:Within last year and has family history of glaucoma and is being monitored and Dental: No current dental problems and Receives regular dental care  Patient Active Problem List   Diagnosis Date Noted   Migraine without aura and without status migrainosus, not intractable 07/05/2022   Acute hypoxemic respiratory failure due to COVID-19 [facility]) 06/03/2020   Nevus 11/11/2017   Arthritis of carpometacarpal (CMC) joints of both thumbs 11/07/2016   Piriformis syndrome of right side 07/09/2016   Hot flashes 01/11/2016   Hemorrhoid 01/11/2016   Hearing loss 01/11/2016   Hyperlipemia 01/11/2016   Hypertension 04/19/2010      Patient Care Team: Karie Georges, MD as PCP - General (Family Medicine)   Outpatient Medications Prior to Visit  Medication Sig   acyclovir (ZOVIRAX) 400 MG tablet TAKE 1 TABLET (400 MG TOTAL) BY MOUTH 5 (FIVE) TIMES DAILY. START AT ONSET OF RASH   mupirocin nasal ointment (BACTROBAN NASAL) 2 % Place 1 application into the nose 2 (two) times daily. Use one-half of tube in each nostril twice daily for five (5) days. After  application, press sides of nose together and gently massage.   [DISCONTINUED] lisinopril (ZESTRIL) 10 MG tablet Take 1 tablet (10 mg total) by mouth daily.   [DISCONTINUED] meloxicam (MOBIC) 15 MG tablet TAKE 1 TABLET BY MOUTH EVERY DAY AS NEEDED FOR PAIN   [DISCONTINUED] SUMAtriptan (IMITREX) 100 MG tablet Take 1 tablet (100 mg total) by mouth 2 (two) times daily as needed. May repeat in 2 hours if headache persists or recurs.   No facility-administered medications prior to visit.    Review of Systems  HENT:  Negative for hearing loss.   Eyes:  Negative for blurred vision.  Respiratory:  Negative for shortness of breath.   Cardiovascular:  Negative for chest pain.  Gastrointestinal: Negative.   Genitourinary: Negative.   Musculoskeletal:  Negative for back pain.  Neurological:  Negative for headaches.  Psychiatric/Behavioral:  Negative for depression.   All other systems reviewed and are negative.      Objective:     BP 116/70 (BP Location: Left Arm, Patient Position: Sitting, Cuff Size: Large)   Pulse 64   Temp 98.2 F (36.8 C) (Oral)   Ht 5\' 7"  (1.702 m)   Wt 190 lb 4.8 oz (86.3 kg)   SpO2 99%   BMI 29.81 kg/m    Physical Exam Vitals reviewed.  Constitutional:  Appearance: Normal appearance. She is well-groomed and normal weight.  HENT:     Right Ear: Tympanic membrane and ear canal normal.     Left Ear: Tympanic membrane and ear canal normal.     Mouth/Throat:     Mouth: Mucous membranes are moist.     Pharynx: No posterior oropharyngeal erythema.  Eyes:     Conjunctiva/sclera: Conjunctivae normal.  Neck:     Thyroid: No thyromegaly.  Cardiovascular:     Rate and Rhythm: Normal rate and regular rhythm.     Pulses: Normal pulses.     Heart sounds: S1 normal and S2 normal.  Pulmonary:     Effort: Pulmonary effort is normal.     Breath sounds: Normal breath sounds and air entry.  Abdominal:     General: Abdomen is flat. Bowel sounds are normal.      Palpations: Abdomen is soft.  Musculoskeletal:     Right lower leg: No edema.     Left lower leg: No edema.  Lymphadenopathy:     Cervical: No cervical adenopathy.  Neurological:     Mental Status: She is alert and oriented to person, place, and time. Mental status is at baseline.     Gait: Gait is intact.  Psychiatric:        Mood and Affect: Mood and affect normal.        Speech: Speech normal.        Behavior: Behavior normal.        Judgment: Judgment normal.      No results found for any visits on 07/15/23.     Assessment & Plan:    Routine Health Maintenance and Physical Exam  Immunization History  Administered Date(s) Administered   Influenza,inj,Quad PF,6+ Mos 08/13/2013, 08/17/2014   Td 01/14/2003   Tdap 01/09/2011, 10/10/2021   Zoster Recombinant(Shingrix) 05/07/2019, 11/26/2019    Health Maintenance  Topic Date Due   COVID-19 Vaccine (1 - 2023-24 season) 07/31/2023 (Originally 06/15/2023)   INFLUENZA VACCINE  01/12/2024 (Originally 05/15/2023)   MAMMOGRAM  02/14/2024   Fecal DNA (Cologuard)  07/11/2025   Cervical Cancer Screening (HPV/Pap Cotest)  11/22/2026   DTaP/Tdap/Td (4 - Td or Tdap) 10/11/2031   Hepatitis C Screening  Completed   HIV Screening  Completed   Zoster Vaccines- Shingrix  Completed   HPV VACCINES  Aged Out    Discussed health benefits of physical activity, and encouraged her to engage in regular exercise appropriate for her age and condition.  Routine general medical examination at a health care facility  Essential hypertension -     Lisinopril; Take 1 tablet (10 mg total) by mouth daily.  Dispense: 90 tablet; Refill: 3  Migraine without aura and without status migrainosus, not intractable -     SUMAtriptan Succinate; Take 1 tablet (100 mg total) by mouth 2 (two) times daily as needed. May repeat in 2 hours if headache persists or recurs.  Dispense: 9 tablet; Refill: 5  Arthritis/arthropathy of multiple joints -     Meloxicam; TAKE 1  TABLET BY MOUTH EVERY DAY AS NEEDED FOR PAIN  Dispense: 30 tablet; Refill: 5  Normal physical exam findings today, will get her mammogram from the imaging center. Reviewed HM and she is UTD on all of her preventative testing. Handouts given on healthy eating and exercise. Counseled patient on her chronic varicose veins seen on exam --advised to wear compression stockings and reduce salt.  Return in 6 months (on 01/13/2024).     Britta Mccreedy  Elmer Sow, MD

## 2023-07-15 NOTE — Patient Instructions (Signed)

## 2023-07-16 ENCOUNTER — Encounter: Payer: Self-pay | Admitting: Family Medicine

## 2023-11-11 ENCOUNTER — Ambulatory Visit: Payer: Managed Care, Other (non HMO)

## 2023-11-11 ENCOUNTER — Encounter: Payer: Self-pay | Admitting: Family Medicine

## 2023-11-11 ENCOUNTER — Ambulatory Visit: Payer: Managed Care, Other (non HMO) | Admitting: Family Medicine

## 2023-11-11 VITALS — BP 120/82 | HR 79 | Temp 98.2°F | Ht 67.0 in | Wt 197.9 lb

## 2023-11-11 DIAGNOSIS — M79605 Pain in left leg: Secondary | ICD-10-CM | POA: Diagnosis not present

## 2023-11-11 DIAGNOSIS — M25552 Pain in left hip: Secondary | ICD-10-CM

## 2023-11-11 DIAGNOSIS — M25562 Pain in left knee: Secondary | ICD-10-CM | POA: Diagnosis not present

## 2023-11-11 NOTE — Progress Notes (Unsigned)
   Established Patient Office Visit  Subjective   Patient ID: Belinda Maxwell, female    DOB: 1966-07-21  Age: 58 y.o. MRN: [id number]  Chief Complaint  Patient presents with   Leg Pain    Patient complains of left buttock, hip and anterior thigh pain x1 year, tried massage and seen by chiropractor and Meloxicam with no relief    Pt reports 1 year or longer history of intermittent left leg pain going down the back of the left leg. It starts in her buttock and goes to the lateral hip and down to her left knee. States that it gets worse with walking, more sudden pain, comes in waves. Occasionally will be the whole leg that is hurting "like a sleeve". Describes the pain as a deep dull ache, sometimes behind the knee and the calf. Meloxicam will help, doesn't completely goes away.  Pt reports that she has a history of slipped disc in her back, states that that was causing right leg issues but this is not the same. No numbness or burning sensation, no loss of ROM. Sometimes has to step cautiously, afraid that her leg  will give out under her.   Pt has been using heating pads, muscle rubs, has been rubbing it and stretching at home without much improvement. Has seen Dr. Katrinka Blazing in the past.      Current Outpatient Medications  Medication Instructions   acyclovir (ZOVIRAX) 400 MG tablet TAKE 1 TABLET (400 MG TOTAL) BY MOUTH 5 (FIVE) TIMES DAILY. START AT ONSET OF RASH   lisinopril (ZESTRIL) 10 mg, Oral, Daily   meloxicam (MOBIC) 15 MG tablet TAKE 1 TABLET BY MOUTH EVERY DAY AS NEEDED FOR PAIN   SUMAtriptan (IMITREX) 100 mg, Oral, 2 times daily PRN, May repeat in 2 hours if headache persists or recurs.    {History (Optional):23778}  ROS    Objective:     BP 120/82   Pulse 79   Temp 98.2 F (36.8 C) (Oral)   Ht 5\' 7"  (1.702 m)   Wt 197 lb 14.4 oz (89.8 kg)   SpO2 99%   BMI 31.00 kg/m  {Vitals History (Optional):23777}  Physical Exam   No results found for any visits on  11/11/23.  {Labs (Optional):23779}  The 10-year ASCVD risk score (Arnett DK, et al., 2019) is: 3.5%    Assessment & Plan:  Left leg pain -     DG Knee 1-2 Views Left; Future -     DG HIP UNILAT W OR W/O PELVIS 2-3 VIEWS LEFT; Future     No follow-ups on file.    Karie Georges, MD

## 2023-11-28 ENCOUNTER — Encounter: Payer: Self-pay | Admitting: Family Medicine

## 2023-11-28 DIAGNOSIS — M25569 Pain in unspecified knee: Secondary | ICD-10-CM

## 2023-12-04 NOTE — Telephone Encounter (Signed)
Please place referral to Dr. Katrinka Blazing at [facility] for her knee pain. Thanks!

## 2023-12-09 ENCOUNTER — Encounter: Payer: Self-pay | Admitting: Family Medicine

## 2023-12-09 ENCOUNTER — Other Ambulatory Visit: Payer: Self-pay

## 2023-12-09 ENCOUNTER — Ambulatory Visit: Payer: Managed Care, Other (non HMO) | Admitting: Family Medicine

## 2023-12-09 VITALS — BP 116/86 | HR 84 | Ht 67.0 in | Wt 196.0 lb

## 2023-12-09 DIAGNOSIS — M2242 Chondromalacia patellae, left knee: Secondary | ICD-10-CM | POA: Insufficient documentation

## 2023-12-09 DIAGNOSIS — S83262A Peripheral tear of lateral meniscus, current injury, left knee, initial encounter: Secondary | ICD-10-CM

## 2023-12-09 DIAGNOSIS — M79604 Pain in right leg: Secondary | ICD-10-CM | POA: Diagnosis not present

## 2023-12-09 DIAGNOSIS — S83289A Other tear of lateral meniscus, current injury, unspecified knee, initial encounter: Secondary | ICD-10-CM | POA: Insufficient documentation

## 2023-12-09 DIAGNOSIS — M79605 Pain in left leg: Secondary | ICD-10-CM

## 2023-12-09 NOTE — Progress Notes (Signed)
 [name] [address] [place] [address] Phone: [phone] Subjective:   INadine Counts, am serving as a scribe for Dr. Antoine Primas.  I'm seeing this patient by the request  of:  Belinda Georges, MD  CC: Knee pain  [id number]  Belinda Maxwell is a 58 y.o. female coming in with complaint of B leg pain. Mostly the L. Chronic pain that has gotten more consistent over the last 5 months. Dull ache, but when walking becomes sharp. Has woken her up at night. Home therapies will calm the pain, but doesn't last long. No numbness.   Previous x-rays of the left knee were in February of this year showing that there was mild tricompartmental arthritic changes.    Past Medical History:  Diagnosis Date   Acute lateral meniscal injury of right knee 06/11/2016   Headache 02/03/2008   Qualifier: Diagnosis of  By: Cato Mulligan MD, Bruce  Menstrual related    Headache(784.0)    Hearing loss    Hemorrhoid 01/11/2016   -sees surgeon in [place]    Hot flashes 01/11/2016   -seeing gynecologist    Hyperlipemia 01/11/2016   Hypertension 04/19/2010   Qualifier: Diagnosis of  By: Cato Mulligan MD, Bruce      Lumbar radiculopathy, right 08/06/2016   Pre-eclampsia    Past Surgical History:  Procedure Laterality Date   CHOLECYSTECTOMY     hemorrhoid banding     Social History   Socioeconomic History   Marital status: Married    Spouse name: Not on file   Number of children: Not on file   Years of education: Not on file   Highest education level: Not on file  Occupational History   Not on file  Tobacco Use   Smoking status: Never   Smokeless tobacco: Never  Substance and Sexual Activity   Alcohol use: No   Drug use: No   Sexual activity: Not on file  Other Topics Concern   Not on file  Social History Narrative   Work or School: homemaker, looking for job      Home Situation: lives with husband and two children      Spiritual Beliefs: Christian       Lifestyle: tries to walk dog on a regular basis; diet is so so            Social Drivers of Corporate investment banker Strain: Not on file  Food Insecurity: Not on file  Transportation Needs: Not on file  Physical Activity: Not on file  Stress: Not on file  Social Connections: Not on file   Allergies  Allergen Reactions   Sulfamethoxazole Hives   Family History  Problem Relation Age of Onset   Hyperlipidemia Mother    Hypertension Mother    Migraines Mother    Glaucoma Mother    Cataracts Mother    Macular degeneration Mother    Diabetes Mother        prediabetic   Kidney disease Mother    Obesity Mother    Breast cancer Mother 7       mastectomy; removed with surgery   Hyperlipidemia Father    Hypertension Father    Heart disease Father 44       AFIB   Obesity Father    Migraines Sister    Stroke Sister 41       secondary to PFO   Hypothyroidism Sister    Migraines Son    Obesity  Maternal Grandmother    Arthritis Maternal Grandmother    Alcohol abuse Maternal Grandfather    Obesity Paternal Grandmother    Diabetes Paternal Grandmother    Gallbladder disease Paternal Grandfather      Current Outpatient Medications (Cardiovascular):    lisinopril (ZESTRIL) 10 MG tablet, Take 1 tablet (10 mg total) by mouth daily.   Current Outpatient Medications (Analgesics):    meloxicam (MOBIC) 15 MG tablet, TAKE 1 TABLET BY MOUTH EVERY DAY AS NEEDED FOR PAIN   SUMAtriptan (IMITREX) 100 MG tablet, Take 1 tablet (100 mg total) by mouth 2 (two) times daily as needed. May repeat in 2 hours if headache persists or recurs.   Current Outpatient Medications (Other):    acyclovir (ZOVIRAX) 400 MG tablet, TAKE 1 TABLET (400 MG TOTAL) BY MOUTH 5 (FIVE) TIMES DAILY. START AT ONSET OF RASH   Reviewed prior external information including notes and imaging from  primary care provider As well as notes that were available from care everywhere and other healthcare systems.  Past  medical history, social, surgical and family history all reviewed in electronic medical record.  No pertanent information unless stated regarding to the chief complaint.   Review of Systems:  No headache, visual changes, nausea, vomiting, diarrhea, constipation, dizziness, abdominal pain, skin rash, fevers, chills, night sweats, weight loss, swollen lymph nodes, body aches, joint swelling, chest pain, shortness of breath, mood changes. POSITIVE muscle aches  Objective  Blood pressure 116/86, pulse 84, height 5\' 7"  (1.702 m), weight 196 lb (88.9 kg), SpO2 96%.   General: No apparent distress alert and oriented x3 mood and affect normal, dressed appropriately.  HEENT: Pupils equal, extraocular movements intact  Respiratory: Patient's speak in full sentences and does not appear short of breath  Cardiovascular: No lower extremity edema, non tender, no erythema  Knee exam shows patient does have crepitus noted.  Some tenderness to palpation in the paraspinal musculature and also noted.  Limited muscular skeletal ultrasound was performed and interpreted by Antoine Primas, M   Patient does have significant narrowing noted of the patellofemoral space noted.  Hypoechoic changes consistent with trace effusion of the patellofemoral joint noted.  Patient does have an abnormality also noted of the lateral meniscus.  Does have some displacement of approximately 25%.  Medial and lateral joint space though very minimal arthritic changes. Impression: Arthritis of the patellofemoral joint as well as the lateral meniscal tear with mild displacement   Impression and Recommendations:     The above documentation has been reviewed and is accurate and complete Judi Saa, DO

## 2023-12-09 NOTE — Patient Instructions (Addendum)
 You have 14 days to return or exchange your brace Call [phone], then return the brace to our office Ice 20 mins 3 x a day Voltaren  Avoid twisting motion 20-30 degree bend at end of pedal stroke Do prescribed exercises at least 3x a week Take Meloxicam in 5 day burst See you again in 6-8 weeks

## 2023-12-09 NOTE — Assessment & Plan Note (Signed)
 Causing impingement of the knee noted.  Trace effusion noted.  Discussed changing different activities that I think will be more beneficial.  Discussed icing regimen and home exercises, which activities to do and which ones to avoid.  Increase as tolerated.  Avoid twisting motions.  Tru pull lite brace given for different activities.  Follow-up again in 6 to 8 weeks worsening pain consider injections

## 2023-12-09 NOTE — Assessment & Plan Note (Signed)
 Meniscal tear noted.  Discussed with patient about icing regimen and home exercises, discussed which activities to do and which ones to avoid.  Increase activity slowly.  Follow-up with me again in 6 to 8 weeks worsening pain consider injection.

## 2024-01-13 ENCOUNTER — Encounter: Payer: Self-pay | Admitting: Family Medicine

## 2024-01-13 ENCOUNTER — Ambulatory Visit: Payer: Managed Care, Other (non HMO) | Admitting: Family Medicine

## 2024-01-13 VITALS — BP 112/78 | HR 67 | Temp 97.9°F | Ht 67.0 in | Wt 196.1 lb

## 2024-01-13 DIAGNOSIS — I10 Essential (primary) hypertension: Secondary | ICD-10-CM | POA: Diagnosis not present

## 2024-01-13 DIAGNOSIS — M129 Arthropathy, unspecified: Secondary | ICD-10-CM

## 2024-01-13 DIAGNOSIS — G43009 Migraine without aura, not intractable, without status migrainosus: Secondary | ICD-10-CM

## 2024-01-13 LAB — COMPREHENSIVE METABOLIC PANEL WITH GFR
ALT: 20 U/L (ref 0–35)
AST: 21 U/L (ref 0–37)
Albumin: 4.6 g/dL (ref 3.5–5.2)
Alkaline Phosphatase: 67 U/L (ref 39–117)
BUN: 19 mg/dL (ref 6–23)
CO2: 24 meq/L (ref 19–32)
Calcium: 10 mg/dL (ref 8.4–10.5)
Chloride: 104 meq/L (ref 96–112)
Creatinine, Ser: 1.04 mg/dL (ref 0.40–1.20)
GFR: 58.62 mL/min — ABNORMAL LOW (ref >60.00)
Glucose, Bld: 99 mg/dL (ref 70–99)
Potassium: 3.9 meq/L (ref 3.5–5.1)
Sodium: 136 meq/L (ref 135–145)
Total Bilirubin: 0.6 mg/dL (ref 0.2–1.2)
Total Protein: 7.5 g/dL (ref 6.0–8.3)

## 2024-01-13 LAB — LIPID PANEL
Cholesterol: 233 mg/dL — ABNORMAL HIGH (ref 0–200)
HDL: 47.6 mg/dL (ref >39.00)
LDL Cholesterol: 151 mg/dL — ABNORMAL HIGH (ref 0–99)
NonHDL: 185.24
Total CHOL/HDL Ratio: 5
Triglycerides: 172 mg/dL — ABNORMAL HIGH (ref 0.0–149.0)
VLDL: 34.4 mg/dL (ref 0.0–40.0)

## 2024-01-13 MED ORDER — SUMATRIPTAN SUCCINATE 100 MG PO TABS: 100.0000 mg | ORAL_TABLET | Freq: Two times a day (BID) | ORAL | 5 refills | Status: DC | PRN

## 2024-01-13 MED ORDER — MELOXICAM 15 MG PO TABS: ORAL_TABLET | ORAL | 5 refills | Status: DC

## 2024-01-13 NOTE — Patient Instructions (Signed)
 Mammogram will be due May 3rd-- please call if you need me to send in an order

## 2024-01-13 NOTE — Assessment & Plan Note (Signed)
On Imitrex 100 mg as needed for acute migraine, pt reports good response to this medication, will refill this for the patient.

## 2024-01-13 NOTE — Progress Notes (Signed)
 Belinda Maxwell [address] [place] [address] Phone: [phone] Subjective:   Belinda Maxwell am a scribe for Dr. Katrinka Blazing.  I'm seeing this patient by the request  of:  Karie Georges, MD  CC: Follow-up  [id number]  12/09/2023 Meniscal tear noted.  Discussed with patient about icing regimen and home exercises, discussed which activities to do and which ones to avoid.  Increase activity slowly.  Follow-up with me again in 6 to 8 weeks worsening pain consider injection.     Causing impingement of the knee noted.  Trace effusion noted.  Discussed changing different activities that I think will be more beneficial.  Discussed icing regimen and home exercises, which activities to do and which ones to avoid.  Increase as tolerated.  Avoid twisting motions.  Tru pull lite brace given for different activities.  Follow-up again in 6 to 8 weeks worsening pain consider injections     Updated 01/20/2024 Belinda Maxwell is a 58 y.o. female coming in with complaint of knee pain, was seen previously for patellofemoral joint arthritis as well as a lateral meniscal tear.  Patient states left leg is better but it is not well. Aches at night. Wakes her up at night. Left hip as well at times. Currently in a brace. It has been helping.        Past Medical History:  Diagnosis Date   Acute lateral meniscal injury of right knee 06/11/2016   Headache 02/03/2008   Qualifier: Diagnosis of  By: Cato Mulligan MD, Bruce  Menstrual related    Headache(784.0)    Hearing loss    Hemorrhoid 01/11/2016   -sees surgeon in [place]    Hot flashes 01/11/2016   -seeing gynecologist    Hyperlipemia 01/11/2016   Hypertension 04/19/2010   Qualifier: Diagnosis of  By: Cato Mulligan MD, Bruce      Lumbar radiculopathy, right 08/06/2016   Pre-eclampsia    Past Surgical History:  Procedure Laterality Date   CHOLECYSTECTOMY     hemorrhoid banding     Social History   Socioeconomic History    Marital status: Married    Spouse name: Not on file   Number of children: Not on file   Years of education: Not on file   Highest education level: Not on file  Occupational History   Not on file  Tobacco Use   Smoking status: Never   Smokeless tobacco: Never  Substance and Sexual Activity   Alcohol use: No   Drug use: No   Sexual activity: Not on file  Other Topics Concern   Not on file  Social History Narrative   Work or School: homemaker, looking for job      Home Situation: lives with husband and two children      Spiritual Beliefs: Christian      Lifestyle: tries to walk dog on a regular basis; diet is so so            Social Drivers of Corporate investment banker Strain: Not on file  Food Insecurity: Not on file  Transportation Needs: Not on file  Physical Activity: Not on file  Stress: Not on file  Social Connections: Not on file   Allergies  Allergen Reactions   Sulfamethoxazole Hives   Family History  Problem Relation Age of Onset   Hyperlipidemia Mother    Hypertension Mother    Migraines Mother    Glaucoma Mother    Cataracts Mother  Macular degeneration Mother    Diabetes Mother        prediabetic   Kidney disease Mother    Obesity Mother    Breast cancer Mother 58       mastectomy; removed with surgery   Hyperlipidemia Father    Hypertension Father    Heart disease Father 84       AFIB   Obesity Father    Migraines Sister    Stroke Sister 42       secondary to PFO   Hypothyroidism Sister    Migraines Son    Obesity Maternal Grandmother    Arthritis Maternal Grandmother    Alcohol abuse Maternal Grandfather    Obesity Paternal Grandmother    Diabetes Paternal Grandmother    Gallbladder disease Paternal Grandfather      Current Outpatient Medications (Cardiovascular):    lisinopril (ZESTRIL) 10 MG tablet, Take 1 tablet (10 mg total) by mouth daily.   Current Outpatient Medications (Analgesics):    meloxicam (MOBIC) 15 MG  tablet, TAKE 1 TABLET BY MOUTH EVERY DAY AS NEEDED FOR PAIN   SUMAtriptan (IMITREX) 100 MG tablet, Take 1 tablet (100 mg total) by mouth 2 (two) times daily as needed. May repeat in 2 hours if headache persists or recurs.   Current Outpatient Medications (Other):    acyclovir (ZOVIRAX) 400 MG tablet, TAKE 1 TABLET (400 MG TOTAL) BY MOUTH 5 (FIVE) TIMES DAILY. START AT ONSET OF RASH   Objective  Blood pressure 116/70, pulse 78, height 5\' 7"  (1.702 m), weight 195 lb (88.5 kg), SpO2 99%.   General: No apparent distress alert and oriented x3 mood and affect normal, dressed appropriately.  HEENT: Pupils equal, extraocular movements intact  Respiratory: Patient's speak in full sentences and does not appear short of breath  Cardiovascular: No lower extremity edema, non tender, no erythema  Knee exam shows left instability noted.  Positive mcmurry test noted.    Limited muscular skeletal ultrasound was performed and interpreted by Antoine Primas, M  Limited ultrasound still shows that there is a meniscal tear noted.  Still has some displacement noted on the lateral aspect.  Approximately 50% displacement noted.  Hypoechoic changes lateral of the patellofemoral joint does seem improved.   Impression and Recommendations:    The above documentation has been reviewed and is accurate and complete Judi Saa, DO

## 2024-01-13 NOTE — Progress Notes (Signed)
 Established Patient Office Visit  Subjective   Patient ID: Belinda Maxwell, female    DOB: 1966-08-14  Age: 58 y.o. MRN: [id number]  Chief Complaint  Patient presents with   Medical Management of Chronic Issues    Pt is here for follow up today on HTN -- she reports she continues to check BP at home occasionally, denies any chest pain, dizziness, or SOB. On lisinopril 10 mg daily, well controlled and chronic.   Pt is due for her annual blood work today, she is fasting. We discussed that in 2019 she had an elevated A1C, but the one in 2020 was normal, pt states she wants to wait and see what her fasting blood sugar is today first. We reviewed her health maintenance as well, she is coming up due for her mammogram, [facility] goes to [facility] to get these done normally.   OA-- did see Dr. Katrinka Blazing for her knee pain, she had [place] in his office and I reviewed the results with her-- she is suspected to have a partial tear of the lateral meniscus on the [place], states she has been using the meloxicam as needed and also PRN diclofenac gel, states this seems to be working for her.   Chronic migraines -- needs refills on her sumatriptan, only using them about 1-2 times per month, not frequently.     Current Outpatient Medications  Medication Instructions   acyclovir (ZOVIRAX) 400 MG tablet TAKE 1 TABLET (400 MG TOTAL) BY MOUTH 5 (FIVE) TIMES DAILY. START AT ONSET OF RASH   lisinopril (ZESTRIL) 10 mg, Oral, Daily   meloxicam (MOBIC) 15 MG tablet TAKE 1 TABLET BY MOUTH EVERY DAY AS NEEDED FOR PAIN   SUMAtriptan (IMITREX) 100 mg, Oral, 2 times daily PRN, May repeat in 2 hours if headache persists or recurs.    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     Objective:     BP 112/78   Pulse 67   Temp 97.9 F (36.6 C) (Oral)   Ht 5\' 7"  (1.702 m)   Wt 196 lb 1.6 oz (89 kg)   SpO2 98%   BMI 30.71 kg/m    Physical Exam Vitals reviewed.  Constitutional:      Appearance: Normal appearance. She is well-groomed and overweight.  HENT:     Right Ear: Tympanic membrane normal.     Left Ear: Tympanic membrane normal.  Neck:     Thyroid: No thyromegaly.  Cardiovascular:     Rate and Rhythm: Normal rate and regular rhythm.     Heart sounds: S1 normal and S2 normal.  Pulmonary:     Effort: Pulmonary effort is normal.     Breath sounds: Normal breath sounds and air entry.  Musculoskeletal:     Right lower leg: No edema.     Left lower leg: No edema.  Neurological:     Mental Status: She  is alert and oriented to person, place, and time. Mental status is at baseline.     Gait: Gait is intact.  Psychiatric:        Mood and Affect: Mood and affect normal.        Speech: Speech normal.        Behavior: Behavior normal.        Judgment: Judgment normal.      No results found for any visits on 01/13/24.    The 10-year ASCVD risk score (Arnett DK, et al., 2019) is: 3.4%    Assessment & Plan:  Primary hypertension Assessment & Plan: Current hypertension medications:       Sig   lisinopril (ZESTRIL) 10 MG tablet (Taking) Take 1 tablet (10 mg total) by mouth daily.      BP is well controlled on the above medications, will continue as prescribed, she is due for her annual CMP today.  Orders: -     Lipid panel; Future -     Comprehensive metabolic panel with GFR;  Future  Arthritis/arthropathy of multiple joints Chronic, stable. Follows with Dr. Katrinka Blazing who dx'd lateral meniscal tear, continue NSAIDS and brace PRN. I encouraged her to follow up with him if pain does not improve, although she reports it is "under control" currently.   -     Meloxicam; TAKE 1 TABLET BY MOUTH EVERY DAY AS NEEDED FOR PAIN  Dispense: 30 tablet; Refill: 5  Migraine without aura and without status migrainosus, not intractable Assessment & Plan: On Imitrex 100 mg as needed for acute migraine, pt reports good response to this medication, will refill this for the patient.   Orders: -     SUMAtriptan Succinate; Take 1 tablet (100 mg total) by mouth 2 (two) times daily as needed. May repeat in 2 hours if headache persists or recurs.  Dispense: 9 tablet; Refill: 5     Return in about 6 months (around 07/15/2024) for annual physical exam.    Karie Georges, MD

## 2024-01-13 NOTE — Assessment & Plan Note (Signed)
 Current hypertension medications:       Sig   lisinopril (ZESTRIL) 10 MG tablet (Taking) Take 1 tablet (10 mg total) by mouth daily.      BP is well controlled on the above medications, will continue as prescribed, she is due for her annual CMP today.

## 2024-01-16 ENCOUNTER — Encounter: Payer: Self-pay | Admitting: Family Medicine

## 2024-01-20 ENCOUNTER — Ambulatory Visit: Payer: Managed Care, Other (non HMO) | Admitting: Family Medicine

## 2024-01-20 ENCOUNTER — Encounter: Payer: Self-pay | Admitting: Family Medicine

## 2024-01-20 ENCOUNTER — Other Ambulatory Visit: Payer: Self-pay

## 2024-01-20 VITALS — BP 116/70 | HR 78 | Ht 67.0 in | Wt 195.0 lb

## 2024-01-20 DIAGNOSIS — M2242 Chondromalacia patellae, left knee: Secondary | ICD-10-CM

## 2024-01-20 DIAGNOSIS — S83262A Peripheral tear of lateral meniscus, current injury, left knee, initial encounter: Secondary | ICD-10-CM

## 2024-01-20 NOTE — Patient Instructions (Signed)
 Good to see you. I agree about the 50% improvement.  Only use the brace with unstable ground and working out.  Ice and topical before bed. Return in 6 to 8 weeks.

## 2024-01-20 NOTE — Assessment & Plan Note (Signed)
 Patient has been a 50% improvement on the knee at this time.  We discussed the potential for the injection.  Patient wanted to hold at this point.  Discussed icing regimen otherwise.  Increase activity slowly.  Again in 2 months.  If not completely resolved we will definitely consider the possibility of injection.  Worsening pain patient will contact us sooner.

## 2024-03-09 ENCOUNTER — Ambulatory Visit: Admitting: Family Medicine

## 2024-07-07 ENCOUNTER — Other Ambulatory Visit: Payer: Self-pay | Admitting: Family Medicine

## 2024-07-07 DIAGNOSIS — I10 Essential (primary) hypertension: Secondary | ICD-10-CM

## 2024-07-16 ENCOUNTER — Ambulatory Visit: Admitting: Family Medicine

## 2024-07-16 ENCOUNTER — Encounter: Payer: Self-pay | Admitting: Family Medicine

## 2024-07-16 VITALS — BP 120/62 | HR 57 | Temp 97.7°F | Ht 67.0 in | Wt 194.8 lb

## 2024-07-16 DIAGNOSIS — I10 Essential (primary) hypertension: Secondary | ICD-10-CM

## 2024-07-16 DIAGNOSIS — M129 Arthropathy, unspecified: Secondary | ICD-10-CM | POA: Diagnosis not present

## 2024-07-16 DIAGNOSIS — Z1231 Encounter for screening mammogram for malignant neoplasm of breast: Secondary | ICD-10-CM

## 2024-07-16 DIAGNOSIS — G43009 Migraine without aura, not intractable, without status migrainosus: Secondary | ICD-10-CM | POA: Diagnosis not present

## 2024-07-16 MED ORDER — LISINOPRIL 10 MG PO TABS: 10.0000 mg | ORAL_TABLET | Freq: Every day | ORAL | 3 refills | Status: AC

## 2024-07-16 MED ORDER — MELOXICAM 15 MG PO TABS: ORAL_TABLET | ORAL | 5 refills | Status: AC

## 2024-07-16 MED ORDER — SUMATRIPTAN SUCCINATE 100 MG PO TABS
100.0000 mg | ORAL_TABLET | Freq: Two times a day (BID) | ORAL | 5 refills | Status: AC | PRN
Start: 2024-07-16 — End: ?

## 2024-07-16 NOTE — Progress Notes (Signed)
 Complete physical exam  Patient: Belinda Maxwell   DOB: [birth year]   58 y.o. Female  MRN: [id number]  Subjective:    Chief Complaint  Patient presents with   Annual Exam    Belinda Maxwell is a 58 y.o. female who presents today for a complete physical exam. She reports consuming a general diet. Occasionally eats processed foods but for the most part tries to eat healthy. Breakfast is yogurt or cereal, snacks consist of crackers, apple, protein bar, etc. Lunch [place] sandwiches, dinner is a protein and a veggie. Some late night eating, snacks. Home exercise routine includes does you tube videos, is doing some stretching. She generally feels well. She reports sleeping fairly well. She does not have additional problems to discuss today.    Most recent fall risk assessment:    12/28/2021    1:31 PM  Fall Risk   Falls in the past year? 1  Number falls in past yr: 1  Injury with Fall? 0  Risk for fall due to : No Fall Risks  Follow up Falls evaluation completed      Data saved with a previous flowsheet row definition     Most recent depression screenings:    07/16/2024    9:01 AM 07/15/2023    8:24 AM  PHQ 2/9 Scores  PHQ - 2 Score 0 0  PHQ- 9 Score 0 3    Vision:Within last year and Dental: No current dental problems and Receives regular dental care  Patient Active Problem List   Diagnosis Date Noted   Chondromalacia of left patellofemoral joint 12/09/2023   Lateral meniscal tear 12/09/2023   Migraine without aura and without status migrainosus, not intractable 07/05/2022   Acute hypoxemic respiratory failure due to COVID-19 [facility]) 06/03/2020   Nevus 11/11/2017   Arthritis of carpometacarpal (CMC) joints of both thumbs 11/07/2016   Piriformis syndrome of right side 07/09/2016   Hot flashes 01/11/2016   Hemorrhoid 01/11/2016   Hearing loss 01/11/2016   Hyperlipemia 01/11/2016   Hypertension 04/19/2010      Patient Care Team: Ozell Heron CHRISTELLA, MD as PCP - General  (Family Medicine)   Outpatient Medications Prior to Visit  Medication Sig   acyclovir  (ZOVIRAX ) 400 MG tablet TAKE 1 TABLET (400 MG TOTAL) BY MOUTH 5 (FIVE) TIMES DAILY. START AT ONSET OF RASH   [DISCONTINUED] lisinopril  (ZESTRIL ) 10 MG tablet TAKE 1 TABLET BY MOUTH EVERY DAY   [DISCONTINUED] meloxicam  (MOBIC ) 15 MG tablet TAKE 1 TABLET BY MOUTH EVERY DAY AS NEEDED FOR PAIN   [DISCONTINUED] SUMAtriptan  (IMITREX ) 100 MG tablet Take 1 tablet (100 mg total) by mouth 2 (two) times daily as needed. May repeat in 2 hours if headache persists or recurs.   No facility-administered medications prior to visit.    Review of Systems  HENT:  Negative for hearing loss.   Eyes:  Negative for blurred vision.  Respiratory:  Negative for shortness of breath.   Cardiovascular:  Negative for chest pain.  Gastrointestinal: Negative.   Genitourinary: Negative.   Musculoskeletal:  Negative for back pain.  Neurological:  Negative for headaches.  Psychiatric/Behavioral:  Negative for depression.        Objective:     BP 120/62   Pulse (!) 57   Temp 97.7 F (36.5 C) (Oral)   Ht 5' 7 (1.702 m)   Wt 194 lb 12.8 oz (88.4 kg)   SpO2 98%   BMI 30.51 kg/m    Physical Exam Vitals reviewed.  Constitutional:      Appearance: Normal appearance. She is well-groomed and overweight.  HENT:     Right Ear: Tympanic membrane and ear canal normal.     Left Ear: Tympanic membrane and ear canal normal.     Mouth/Throat:     Mouth: Mucous membranes are moist.     Pharynx: No posterior oropharyngeal erythema.  Eyes:     Conjunctiva/sclera: Conjunctivae normal.  Neck:     Thyroid : No thyromegaly.  Cardiovascular:     Rate and Rhythm: Normal rate and regular rhythm.     Pulses: Normal pulses.     Heart sounds: S1 normal and S2 normal.  Pulmonary:     Effort: Pulmonary effort is normal.     Breath sounds: Normal breath sounds and air entry.  Abdominal:     General: Abdomen is flat. Bowel sounds are  normal.     Palpations: Abdomen is soft.  Musculoskeletal:     Right lower leg: No edema.     Left lower leg: No edema.  Lymphadenopathy:     Cervical: No cervical adenopathy.  Neurological:     Mental Status: She is alert and oriented to person, place, and time. Mental status is at baseline.     Gait: Gait is intact.  Psychiatric:        Mood and Affect: Mood and affect normal.        Speech: Speech normal.        Behavior: Behavior normal.        Judgment: Judgment normal.      No results found for any visits on 07/16/24.     Assessment & Plan:    Routine Health Maintenance and Physical Exam  Immunization History  Administered Date(s) Administered   Influenza,inj,Quad PF,6+ Mos 08/13/2013, 08/17/2014   PPD Test 03/15/2022   Td 01/14/2003   Tdap 01/09/2011, 10/10/2021   Zoster Recombinant(Shingrix ) 05/07/2019, 11/26/2019    Health Maintenance  Topic Date Due   Pneumococcal Vaccine: 50+ Years (1 of 1 - PCV) Never done   Mammogram  02/15/2024   COVID-19 Vaccine (1 - 2025-26 season) Never done   Influenza Vaccine  01/11/2025 (Originally 05/14/2024)   Fecal DNA (Cologuard)  07/11/2025   Cervical Cancer Screening (HPV/Pap Cotest)  11/22/2026   DTaP/Tdap/Td (4 - Td or Tdap) 10/11/2031   Hepatitis C Screening  Completed   HIV Screening  Completed   Zoster Vaccines- Shingrix   Completed   Hepatitis B Vaccines 19-59 Average Risk  Aged Out   HPV VACCINES  Aged Out   Meningococcal B Vaccine  Aged Out    Discussed health benefits of physical activity, and encouraged her to engage in regular exercise appropriate for her age and condition.  Breast cancer screening by mammogram -     3D Screening Mammogram, Left and Right; Future  Essential hypertension -     Lisinopril ; Take 1 tablet (10 mg total) by mouth daily.  Dispense: 90 tablet; Refill: 3  Arthritis/arthropathy of multiple joints -     Meloxicam ; TAKE 1 TABLET BY MOUTH EVERY DAY AS NEEDED FOR PAIN  Dispense: 30  tablet; Refill: 5  Migraine without aura and without status migrainosus, not intractable Assessment & Plan: On Imitrex  100 mg as needed for acute migraine, pt reports good response to this medication, will refill this for the patient.   Orders: -     SUMAtriptan  Succinate; Take 1 tablet (100 mg total) by mouth 2 (two) times daily as needed. May repeat  in 2 hours if headache persists or recurs.  Dispense: 9 tablet; Refill: 5  Primary hypertension Assessment & Plan: Current hypertension medications:       Sig   lisinopril  (ZESTRIL ) 10 MG tablet Take 1 tablet (10 mg total) by mouth daily.      BP is well controlled on the above medications, will continue as prescribed.   General physical exam findings are normal today. I reviewed the patient's preventative testing, immunizations, and lifestyle habits. I made appropriate recommendations and placed orders for the appropriate tests and/or vaccinations. I counseled the patient on the CDC's recommendations for healthy exercise and diet. I counseled the patient on healthy sleep habits and stress management. Handouts to reinforce the counseling were given at the conclusion of the visit.    Return in about 6 months (around 01/14/2025) for HTN.      Heron CHRISTELLA Sharper, MD

## 2024-07-23 NOTE — Assessment & Plan Note (Signed)
 Current hypertension medications:       Sig   lisinopril  (ZESTRIL ) 10 MG tablet Take 1 tablet (10 mg total) by mouth daily.      BP is well controlled on the above medications, will continue as prescribed.

## 2024-07-23 NOTE — Assessment & Plan Note (Signed)
On Imitrex 100 mg as needed for acute migraine, pt reports good response to this medication, will refill this for the patient.

## 2024-08-02 LAB — HM MAMMOGRAPHY

## 2024-08-09 ENCOUNTER — Encounter: Payer: Self-pay | Admitting: Family Medicine

## 2024-08-09 ENCOUNTER — Ambulatory Visit: Payer: Self-pay | Admitting: Family Medicine

## 2025-01-14 ENCOUNTER — Ambulatory Visit: Admitting: Family Medicine
